# Patient Record
Sex: Male | Born: 1980 | Hispanic: Yes | Marital: Married | Smoking: Never smoker
Health system: Southern US, Community
[De-identification: ages and names within clinical notes are randomized; demographics above are authoritative.]

## PROBLEM LIST (undated history)

## (undated) DIAGNOSIS — G510 Bell's palsy: Secondary | ICD-10-CM

## (undated) HISTORY — DX: Bell's palsy: G51.0

---

## 2017-02-24 ENCOUNTER — Emergency Department (HOSPITAL_COMMUNITY): Payer: Self-pay

## 2017-02-24 ENCOUNTER — Emergency Department (HOSPITAL_COMMUNITY)
Admission: EM | Admit: 2017-02-24 | Discharge: 2017-02-25 | Disposition: A | Discharge status: Home / Self Care | Payer: Self-pay | Attending: Emergency Medicine | Admitting: Emergency Medicine

## 2017-02-24 ENCOUNTER — Encounter (HOSPITAL_COMMUNITY): Payer: Self-pay

## 2017-02-24 DIAGNOSIS — W11XXXA Fall on and from ladder, initial encounter: Secondary | ICD-10-CM | POA: Insufficient documentation

## 2017-02-24 DIAGNOSIS — S41112A Laceration without foreign body of left upper arm, initial encounter: Secondary | ICD-10-CM | POA: Insufficient documentation

## 2017-02-24 DIAGNOSIS — Y999 Unspecified external cause status: Secondary | ICD-10-CM | POA: Insufficient documentation

## 2017-02-24 DIAGNOSIS — Z23 Encounter for immunization: Secondary | ICD-10-CM | POA: Insufficient documentation

## 2017-02-24 DIAGNOSIS — Y929 Unspecified place or not applicable: Secondary | ICD-10-CM | POA: Insufficient documentation

## 2017-02-24 DIAGNOSIS — W19XXXA Unspecified fall, initial encounter: Secondary | ICD-10-CM

## 2017-02-24 DIAGNOSIS — Y939 Activity, unspecified: Secondary | ICD-10-CM | POA: Insufficient documentation

## 2017-02-24 NOTE — ED Triage Notes (Addendum)
Pt reports cutting arm on a stick/limb after falling off a ladder this afternoon.  Large gash noted on anterior/medial upper arm.  Pressure dressing applied.  Pt is concerned about having foreign body in arm.  Pain score 9/10.  Pt has not taken anything for pain.

## 2017-02-25 ENCOUNTER — Emergency Department (HOSPITAL_COMMUNITY): Payer: Self-pay

## 2017-02-25 MED ORDER — TETANUS-DIPHTH-ACELL PERTUSSIS 5-2.5-18.5 LF-MCG/0.5 IM SUSP
0.5000 mL | Freq: Once | INTRAMUSCULAR | Status: AC
  Administered 2017-02-25: 0.5 mL via INTRAMUSCULAR
  Filled 2017-02-25: qty 0.5

## 2017-02-25 MED ORDER — LIDOCAINE-EPINEPHRINE (PF) 2 %-1:200000 IJ SOLN
20.0000 mL | Freq: Once | INTRAMUSCULAR | Status: AC
  Administered 2017-02-25: 20 mL

## 2017-02-25 MED ORDER — DTAP-HEPATITIS B RECOMB-IPV IM SUSP: 0.5000 mL | Freq: Once | INTRAMUSCULAR | Status: DC

## 2017-02-25 MED ORDER — OXYCODONE-ACETAMINOPHEN 5-325 MG PO TABS
2.0000 | ORAL_TABLET | Freq: Once | ORAL | Status: AC
  Administered 2017-02-25: 2 via ORAL
  Filled 2017-02-25: qty 2

## 2017-02-25 MED ORDER — IBUPROFEN 800 MG PO TABS: 800.0000 mg | ORAL_TABLET | Freq: Three times a day (TID) | ORAL | 0 refills | Status: DC

## 2017-02-25 MED ORDER — CEPHALEXIN 500 MG PO CAPS: 500.0000 mg | ORAL_CAPSULE | Freq: Four times a day (QID) | ORAL | 0 refills | Status: DC

## 2017-02-25 NOTE — ED Provider Notes (Signed)
[facility]-EMERGENCY DEPT Provider Note   CSN: [id number] Arrival date & time: 02/24/17  1027     History   Chief Complaint Chief Complaint  Patient presents with  . Fall  . Laceration    HPI William Escobar is a 36 y.o. male with a hx of no major medical problems presents to the Emergency Department complaining of acute laceration after a 12 foot fall from a ladder at 5pm today.  Pt reports he landed on a pile of debris and is concerned that he might have a foreign body in the laceration in his arm.  Pt reports falling onto his back, but denies LOC, neck pain or back pain.  No treatments PTA.  Movement and palpation of the left arm makes it worse, nothing makes it better.  Pt denies numbness, tingling, weakness, loss of bowel or bladder control.  He reports he is able to walk.  The history is provided by the patient and medical records. No language interpreter was used.    History reviewed. No pertinent past medical history.  There are no active problems to display for this patient.   History reviewed. No pertinent surgical history.     Home Medications    Prior to Admission medications   Medication Sig Start Date End Date Taking? Authorizing Provider  cephALEXin (KEFLEX) 500 MG capsule Take 1 capsule (500 mg total) by mouth 4 (four) times daily. 02/25/17   Rustyn Conery, Dahlia Client, PA-C  ibuprofen (ADVIL,MOTRIN) 800 MG tablet Take 1 tablet (800 mg total) by mouth 3 (three) times daily. 02/25/17   Keyoni Lapinski, Dahlia Client, PA-C    Family History History reviewed. No pertinent family history.  Social History Social History  Substance Use Topics  . Smoking status: Never Smoker  . Smokeless tobacco: Never Used  . Alcohol use Yes     Allergies   Patient has no allergy information on record.   Review of Systems Review of Systems  Constitutional: Negative for appetite change, diaphoresis, fatigue, fever and unexpected weight change.  HENT:  Negative for mouth sores.   Eyes: Negative for visual disturbance.  Respiratory: Negative for cough, chest tightness, shortness of breath and wheezing.   Cardiovascular: Negative for chest pain.  Gastrointestinal: Negative for abdominal pain, constipation, diarrhea, nausea and vomiting.  Endocrine: Negative for polydipsia, polyphagia and polyuria.  Genitourinary: Negative for dysuria, frequency, hematuria and urgency.  Musculoskeletal: Positive for arthralgias. Negative for back pain and neck stiffness.  Skin: Positive for wound. Negative for rash.  Allergic/Immunologic: Negative for immunocompromised state.  Neurological: Negative for syncope, light-headedness and headaches.  Hematological: Does not bruise/bleed easily.  Psychiatric/Behavioral: Negative for sleep disturbance. The patient is not nervous/anxious.      Physical Exam Updated Vital Signs BP (!) 129/99 (BP Location: Right Arm)   Pulse 98   Temp 97.9 F (36.6 C) (Oral)   Resp 18   SpO2 99%   Physical Exam  Constitutional: He is oriented to person, place, and time. He appears well-developed and well-nourished. No distress.  Awake, alert, nontoxic appearance  HENT:  Head: Normocephalic and atraumatic.  Mouth/Throat: Oropharynx is clear and moist. No oropharyngeal exudate.  Eyes: Conjunctivae are normal. No scleral icterus.  Neck: Normal range of motion. Neck supple. No spinous process tenderness and no muscular tenderness present. No neck rigidity. Normal range of motion present.  Full range of motion without pain No midline or paraspinal tenderness No step-off or deformity  Cardiovascular: Normal rate, regular rhythm, normal heart sounds and intact distal  pulses.   No murmur heard. Capillary refill < 3 sec  Pulmonary/Chest: Effort normal and breath sounds normal. No respiratory distress. He has no wheezes. He exhibits no tenderness.  Equal chest expansion No contusion or ecchymosis No flail segment  Abdominal:  Soft. Bowel sounds are normal. He exhibits no mass. There is no tenderness. There is no rebound and no guarding.  Soft and nontender No ecchymosis or contusion  Musculoskeletal: Normal range of motion. He exhibits no edema.  Full range of motion of the left upper extremity 3 cm laceration and a large puncture wound to the medial portion of the left upper arm, oozing blood. Full range of motion of the T-spine and L-spine No midline or paraspinal tenderness to the T-spine or L-spine  Neurological: He is alert and oriented to person, place, and time.  Strength 5/5 in the left upper arm bilateral upper extremities Sensation intact to normal touch in the bilateral upper extremities Speech is clear and goal oriented Moves extremities without ataxia Steady gait  Skin: Skin is warm and dry. He is not diaphoretic.  Psychiatric: He has a normal mood and affect.  Nursing note and vitals reviewed.    ED Treatments / Results   Radiology Ct Head Wo Contrast  Result Date: 02/25/2017 CLINICAL DATA:  36 year old male with fall. EXAM: CT HEAD WITHOUT CONTRAST CT CERVICAL SPINE WITHOUT CONTRAST TECHNIQUE: Multidetector CT imaging of the head and cervical spine was performed following the standard protocol without intravenous contrast. Multiplanar CT image reconstructions of the cervical spine were also generated. COMPARISON:  None. FINDINGS: CT HEAD FINDINGS Brain: The ventricles and sulci appropriate size for patient's age. The great white matter discrimination is preserved. There is no acute intracranial hemorrhage. No mass effect or midline shift noted. No extra-axial fluid collection. There is slight prominence of the posterior fossa CSF space which may represent a dilated cisterna magna versus an arachnoid cyst. MRI may provide better evaluation. Vascular: No hyperdense vessel or unexpected calcification. Skull: Normal. Negative for fracture or focal lesion. Sinuses/Orbits: No acute finding. Other: None  CT CERVICAL SPINE FINDINGS Alignment: No acute subluxation. Skull base and vertebrae: No acute fracture. No primary bone lesion or focal pathologic process. Soft tissues and spinal canal: No prevertebral fluid or swelling. No visible canal hematoma. Disc levels: No acute finding. Mild degenerative changes at C3-C4 on the right. Upper chest: Negative. Other: None IMPRESSION: 1. No acute intracranial pathology. 2. Mildly dilated posterior fossa CSF space which may represent a prominent cisterna magna versus an arachnoid cyst. 3. No acute/traumatic cervical spine pathology. Electronically Signed   By: Elgie Collard M.D.   On: 02/25/2017 03:44   Ct Cervical Spine Wo Contrast  Result Date: 02/25/2017 CLINICAL DATA:  36 year old male with fall. EXAM: CT HEAD WITHOUT CONTRAST CT CERVICAL SPINE WITHOUT CONTRAST TECHNIQUE: Multidetector CT imaging of the head and cervical spine was performed following the standard protocol without intravenous contrast. Multiplanar CT image reconstructions of the cervical spine were also generated. COMPARISON:  None. FINDINGS: CT HEAD FINDINGS Brain: The ventricles and sulci appropriate size for patient's age. The great white matter discrimination is preserved. There is no acute intracranial hemorrhage. No mass effect or midline shift noted. No extra-axial fluid collection. There is slight prominence of the posterior fossa CSF space which may represent a dilated cisterna magna versus an arachnoid cyst. MRI may provide better evaluation. Vascular: No hyperdense vessel or unexpected calcification. Skull: Normal. Negative for fracture or focal lesion. Sinuses/Orbits: No acute finding. Other: None  CT CERVICAL SPINE FINDINGS Alignment: No acute subluxation. Skull base and vertebrae: No acute fracture. No primary bone lesion or focal pathologic process. Soft tissues and spinal canal: No prevertebral fluid or swelling. No visible canal hematoma. Disc levels: No acute finding. Mild  degenerative changes at C3-C4 on the right. Upper chest: Negative. Other: None IMPRESSION: 1. No acute intracranial pathology. 2. Mildly dilated posterior fossa CSF space which may represent a prominent cisterna magna versus an arachnoid cyst. 3. No acute/traumatic cervical spine pathology. Electronically Signed   By: Elgie Collard M.D.   On: 02/25/2017 03:44   Dg Humerus Left  Result Date: 02/25/2017 CLINICAL DATA:  Fall off ladder this afternoon with left arm pain and laceration, cut arm on stick/limb. Question foreign body. EXAM: LEFT HUMERUS - 2+ VIEW COMPARISON:  None. FINDINGS: There is no evidence of fracture or other focal bone lesions. Soft tissue air in the proximal medial upper arm just distal to the axilla. No radiopaque foreign body. IMPRESSION: Soft tissue air in the medial upper arm consistent with laceration. No radiopaque foreign body or acute osseous abnormality. Electronically Signed   By: Rubye Oaks M.D.   On: 02/25/2017 00:01    Procedures .Marland KitchenLaceration Repair Date/Time: 02/25/2017 3:00 AM Performed by: Dierdre Forth Authorized by: Dierdre Forth   Consent:    Consent obtained:  Verbal   Consent given by:  Patient   Risks discussed:  Infection, pain, poor cosmetic result and retained foreign body   Alternatives discussed:  No treatment Anesthesia (see MAR for exact dosages):    Anesthesia method:  Local infiltration   Local anesthetic:  Lidocaine 2% WITH epi (12mL) Laceration details:    Location:  Shoulder/arm   Shoulder/arm location:  L upper arm   Length (cm):  3 Repair type:    Repair type:  Simple Pre-procedure details:    Preparation:  Patient was prepped and draped in usual sterile fashion Exploration:    Hemostasis achieved with:  Epinephrine and direct pressure   Wound exploration comment:  Entire depth of wound irrigated and probed; unable to visualize the base due to tracking of the wound.   Contaminated: yes   Treatment:    Area  cleansed with:  Saline   Amount of cleaning:  Extensive   Irrigation solution:  Sterile water   Irrigation volume:    Irrigation method:  Syringe Skin repair:    Repair method:  Sutures   Suture size:  3-0   Suture material:  Plain gut   Suture technique:  Simple interrupted   Number of sutures:  3 Approximation:    Approximation:  Loose Post-procedure details:    Dressing:  Open (no dressing)   Patient tolerance of procedure:  Tolerated well, no immediate complications    (including critical care time)  Medications Ordered in ED Medications  lidocaine-EPINEPHrine (XYLOCAINE W/EPI) 2 %-1:200000 (PF) injection 20 mL (not administered)  oxyCODONE-acetaminophen (PERCOCET/ROXICET) 5-325 MG per tablet 2 tablet (2 tablets Oral Given 02/25/17 0103)  Tdap (BOOSTRIX) injection 0.5 mL (0.5 mLs Intramuscular Given 02/25/17 0103)     Initial Impression / Assessment and Plan / ED Course  I have reviewed the triage vital signs and the nursing notes.  Pertinent labs & imaging results that were available during my care of the patient were reviewed by me and considered in my medical decision making (see chart for details).     Patient presents after 12 foot fall with large puncture wound to the left upper arm.  Patient is concerned about retained foreign body.  None visualized on plain film or with wound exploration.  Tetanus updated.  Pressure irrigation performed. Wound explored in a bloodless field.  Laceration occurred < 8 hours prior to repair which was well tolerated.  Tdap updated.  Pt has no comorbidities to effect normal wound healing. Pt discharged with antibiotics, due to the contaminated nature of the wound.  Discussed suture home care with patient and answered questions.  If retained wood, it should begin to work itself out.  Pt to follow-up for wound check and suture removal in 7 days; they are to return to the ED sooner for signs of infection.   Due to significant height of  the fall and patient's distraction with left arm laceration, CT head and neck were obtained.  No acute abnormalities are noted.  Of note, patient with a prominent cisterna magna versus an arachnoid cyst.  Patient without headache or vision changes.  Will refer to primary care and neurology for outpatient MRI for further investigation.   Pt walks with steady gait.  Pt continues to deny back or hip pain on repeat exam.  Pt is hemodynamically stable with no complaints prior to dc.    Final Clinical Impressions(s) / ED Diagnoses   Final diagnoses:  Fall, initial encounter  Arm laceration, left, initial encounter    New Prescriptions New Prescriptions   CEPHALEXIN (KEFLEX) 500 MG CAPSULE    Take 1 capsule (500 mg total) by mouth 4 (four) times daily.   IBUPROFEN (ADVIL,MOTRIN) 800 MG TABLET    Take 1 tablet (800 mg total) by mouth 3 (three) times daily.     Braedyn Riggle, Dahlia ClientHannah, PA-C 02/25/17 0413    Ward, Layla MawKristen N, DO 02/25/17 0530

## 2017-02-25 NOTE — ED Notes (Signed)
Pt. Ambulated in hallway without distress.

## 2017-02-25 NOTE — Discharge Instructions (Signed)
1. Medications: ibuprofen for pain, keflex to help prevent infection, usual home medications 2. Treatment: rest, drink plenty of fluids, warm compresses to the wound.  No foreign body was seen on x-ray or able to be located with exploration of the wound, but you may still have some wood in the wound.  If so, it will begin to work its way out.  If symptoms worsen or signs of infection develop please return to the ED.   3. Follow Up: Please followup with your primary doctor and neurology in 1 week for discussion of your CT scan.  You may need an MRI for further evaluation.

## 2017-02-25 NOTE — ED Notes (Signed)
Patient transported to CT 

## 2018-06-20 ENCOUNTER — Emergency Department (HOSPITAL_COMMUNITY)
Admission: EM | Admit: 2018-06-20 | Discharge: 2018-06-20 | Disposition: A | Discharge status: Home / Self Care | Payer: Self-pay | Attending: Emergency Medicine | Admitting: Emergency Medicine

## 2018-06-20 ENCOUNTER — Encounter (HOSPITAL_COMMUNITY): Payer: Self-pay

## 2018-06-20 ENCOUNTER — Emergency Department (HOSPITAL_COMMUNITY): Payer: Self-pay

## 2018-06-20 ENCOUNTER — Other Ambulatory Visit: Payer: Self-pay

## 2018-06-20 DIAGNOSIS — Z79899 Other long term (current) drug therapy: Secondary | ICD-10-CM | POA: Insufficient documentation

## 2018-06-20 DIAGNOSIS — J101 Influenza due to other identified influenza virus with other respiratory manifestations: Secondary | ICD-10-CM | POA: Insufficient documentation

## 2018-06-20 DIAGNOSIS — J111 Influenza due to unidentified influenza virus with other respiratory manifestations: Secondary | ICD-10-CM

## 2018-06-20 LAB — BASIC METABOLIC PANEL
Anion gap: 11 (ref 5–15)
BUN: 7 mg/dL (ref 6–20)
CO2: 23 mmol/L (ref 22–32)
Calcium: 9.1 mg/dL (ref 8.9–10.3)
Chloride: 103 mmol/L (ref 98–111)
Creatinine, Ser: 0.59 mg/dL — ABNORMAL LOW (ref 0.61–1.24)
GFR calc Af Amer: >60 mL/min (ref >60)
GFR calc non Af Amer: >60 mL/min (ref >60)
Glucose, Bld: 134 mg/dL — ABNORMAL HIGH (ref 70–99)
Potassium: 3.7 mmol/L (ref 3.5–5.1)
Sodium: 137 mmol/L (ref 135–145)

## 2018-06-20 LAB — CBC
HCT: 45.6 % (ref 39.0–52.0)
Hemoglobin: 15.1 g/dL (ref 13.0–17.0)
MCH: 30.1 pg (ref 26.0–34.0)
MCHC: 33.1 g/dL (ref 30.0–36.0)
MCV: 91 fL (ref 80.0–100.0)
Platelets: 293 10*3/uL (ref 150–400)
RBC: 5.01 MIL/uL (ref 4.22–5.81)
RDW: 12.9 % (ref 11.5–15.5)
WBC: 12.3 10*3/uL — ABNORMAL HIGH (ref 4.0–10.5)
nRBC: 0 % (ref 0.0–0.2)

## 2018-06-20 LAB — I-STAT TROPONIN, ED: Troponin i, poc: 0 ng/mL (ref 0.00–0.08)

## 2018-06-20 MED ORDER — SODIUM CHLORIDE 0.9% FLUSH: 3.0000 mL | Freq: Once | INTRAVENOUS | Status: DC

## 2018-06-20 MED ORDER — PROMETHAZINE-DM 6.25-15 MG/5ML PO SYRP: 10.0000 mL | ORAL_SOLUTION | Freq: Four times a day (QID) | ORAL | 0 refills | Status: DC | PRN

## 2018-06-20 MED ORDER — IBUPROFEN 800 MG PO TABS: 800.0000 mg | ORAL_TABLET | Freq: Three times a day (TID) | ORAL | 0 refills | Status: DC | PRN

## 2018-06-20 MED ORDER — KETOROLAC TROMETHAMINE 30 MG/ML IJ SOLN
30.0000 mg | Freq: Once | INTRAMUSCULAR | Status: AC
  Administered 2018-06-20: 30 mg via INTRAVENOUS
  Filled 2018-06-20: qty 1

## 2018-06-20 MED ORDER — SODIUM CHLORIDE 0.9 % IV BOLUS
1000.0000 mL | Freq: Once | INTRAVENOUS | Status: AC
  Administered 2018-06-20: 1000 mL via INTRAVENOUS

## 2018-06-20 MED ORDER — GUAIFENESIN ER 1200 MG PO TB12: 1.0000 | ORAL_TABLET | Freq: Two times a day (BID) | ORAL | 0 refills | Status: DC

## 2018-06-20 NOTE — ED Notes (Signed)
EKG given to EDP,Isaacs, MD., for review. 

## 2018-06-20 NOTE — Discharge Instructions (Addendum)
Increase your fluid intake and rest as much as possible.  Return here as needed.  Also take 1000 mg of Tylenol every 4 hours.

## 2018-06-20 NOTE — ED Triage Notes (Signed)
Pt reports chest pain, sore throat, nausea, SOB, and headache x4 days.

## 2018-06-20 NOTE — ED Provider Notes (Signed)
[facility]-EMERGENCY DEPT Provider Note   CSN: [id number] Arrival date & time: 06/20/18  1911    History   Chief Complaint Chief Complaint  Patient presents with  . Headache  . Chest Pain    HPI William Escobar is a 38 y.o. male.     HPI Patient presents to the emergency department with cough, sore throat, nausea, headache and nasal congestion over the last 4 days.  The patient states that he has taken over-the-counter medications without significant relief of his symptoms.  Patient states that he has been having chills as well.  The patient is girlfriend states that he has been laying around the last 4 days.  The patient denies chest pain, shortness of breath, headache,blurred vision, neck pain,weakness, numbness, dizziness, anorexia, edema, abdominal pain, nausea, vomiting, diarrhea, rash, back pain, dysuria, hematemesis, bloody stool, near syncope, or syncope. History reviewed. No pertinent past medical history.  There are no active problems to display for this patient.   History reviewed. No pertinent surgical history.      Home Medications    Prior to Admission medications   Medication Sig Start Date End Date Taking? Authorizing Provider  acetaminophen (TYLENOL) 325 MG tablet Take 650 mg by mouth every 6 (six) hours as needed for mild pain or headache.   Yes [provider]  guaifenesin (ROBITUSSIN) 100 MG/5ML syrup Take 100 mg by mouth 3 (three) times daily as needed for cough or congestion.   Yes [provider]  cephALEXin (KEFLEX) 500 MG capsule Take 1 capsule (500 mg total) by mouth 4 (four) times daily. Patient not taking: Reported on 06/20/2018 02/25/17   Muthersbaugh, Dahlia Client, PA-C  ibuprofen (ADVIL,MOTRIN) 800 MG tablet Take 1 tablet (800 mg total) by mouth 3 (three) times daily. Patient not taking: Reported on 06/20/2018 02/25/17   Muthersbaugh, Boyd Kerbs    Family History History reviewed. No pertinent family  history.  Social History Social History   Tobacco Use  . Smoking status: Never Smoker  . Smokeless tobacco: Never Used  Substance Use Topics  . Alcohol use: Yes  . Drug use: Yes    Types: Marijuana     Allergies   Patient has no known allergies.   Review of Systems Review of Systems All other systems negative except as documented in the HPI. All pertinent positives and negatives as reviewed in the HPI.  Physical Exam Updated Vital Signs BP (!) 153/96 (BP Location: Left Arm)   Pulse (!) 120   Temp 98.4 F (36.9 C) (Oral)   Resp 18   SpO2 98%   Physical Exam Vitals signs and nursing note reviewed.  Constitutional:      General: He is not in acute distress.    Appearance: He is well-developed.  HENT:     Head: Normocephalic and atraumatic.  Eyes:     Pupils: Pupils are equal, round, and reactive to light.  Neck:     Musculoskeletal: Normal range of motion and neck supple.  Cardiovascular:     Rate and Rhythm: Normal rate and regular rhythm.     Heart sounds: Normal heart sounds. No murmur. No friction rub. No gallop.   Pulmonary:     Effort: Pulmonary effort is normal. No respiratory distress.     Breath sounds: Normal breath sounds. No wheezing.  Abdominal:     General: Bowel sounds are normal. There is no distension.     Palpations: Abdomen is soft.     Tenderness: There is no  abdominal tenderness.  Skin:    General: Skin is warm and dry.     Capillary Refill: Capillary refill takes less than 2 seconds.     Findings: No erythema or rash.  Neurological:     Mental Status: He is alert and oriented to person, place, and time.     Motor: No abnormal muscle tone.     Coordination: Coordination normal.  Psychiatric:        Behavior: Behavior normal.      ED Treatments / Results  Labs (all labs ordered are listed, but only abnormal results are displayed) Labs Reviewed  BASIC METABOLIC PANEL - Abnormal; Notable for the following components:      Result  Value   Glucose, Bld 134 (*)    Creatinine, Ser 0.59 (*)    All other components within normal limits  CBC - Abnormal; Notable for the following components:   WBC 12.3 (*)    All other components within normal limits  I-STAT TROPONIN, ED    EKG EKG Interpretation  Date/Time:  Saturday June 20 2018 19:22:33 EST Ventricular Rate:  114 PR Interval:    QRS Duration: 104 QT Interval:  343 QTC Calculation: 473 R Axis:   64 Text Interpretation:  Sinus tachycardia Probable left atrial enlargement RSR' in V1 or V2, right VCD or RVH Abnormal inferior Q waves Borderline prolonged QT interval No old tracing to compare Confirmed by Jacalyn Lefevre [phone]) on 06/20/2018 11:16:01 PM   Radiology Dg Chest 2 View  Result Date: 06/20/2018 CLINICAL DATA:  Chest pain EXAM: CHEST - 2 VIEW COMPARISON:  None. FINDINGS: Heart and mediastinal contours are within normal limits. No focal opacities or effusions. No acute bony abnormality. IMPRESSION: No active cardiopulmonary disease. Electronically Signed   By: Charlett Nose M.D.   On: 06/20/2018 21:07    Procedures Procedures (including critical care time)  Medications Ordered in ED Medications  ketorolac (TORADOL) 30 MG/ML injection 30 mg (30 mg Intravenous Given 06/20/18 2104)  sodium chloride 0.9 % bolus 1,000 mL (0 mLs Intravenous Stopped 06/20/18 2153)     Initial Impression / Assessment and Plan / ED Course  I have reviewed the triage vital signs and the nursing notes.  Pertinent labs & imaging results that were available during my care of the patient were reviewed by me and considered in my medical decision making (see chart for details).       Patient has influenza based off of his HPI and physical exam findings.  Patient was given IV fluids and has had some improvement in his symptoms.  Is to return here as needed.  Told to rest and take Tylenol for fever.   Final Clinical Impressions(s) / ED Diagnoses   Final diagnoses:  None     ED Discharge Orders    None       Kyra Manges 06/20/18 2336    Jacalyn Lefevre, MD 06/21/18 [phone]

## 2018-11-19 ENCOUNTER — Emergency Department (HOSPITAL_COMMUNITY)
Admission: EM | Admit: 2018-11-19 | Discharge: 2018-11-19 | Disposition: A | Discharge status: Home / Self Care | Payer: Self-pay | Attending: Emergency Medicine | Admitting: Emergency Medicine

## 2018-11-19 ENCOUNTER — Emergency Department (HOSPITAL_COMMUNITY): Payer: Self-pay

## 2018-11-19 ENCOUNTER — Other Ambulatory Visit: Payer: Self-pay

## 2018-11-19 ENCOUNTER — Encounter (HOSPITAL_COMMUNITY): Payer: Self-pay | Admitting: Emergency Medicine

## 2018-11-19 DIAGNOSIS — F1290 Cannabis use, unspecified, uncomplicated: Secondary | ICD-10-CM | POA: Insufficient documentation

## 2018-11-19 DIAGNOSIS — R0602 Shortness of breath: Secondary | ICD-10-CM | POA: Insufficient documentation

## 2018-11-19 DIAGNOSIS — R072 Precordial pain: Secondary | ICD-10-CM | POA: Insufficient documentation

## 2018-11-19 DIAGNOSIS — R002 Palpitations: Secondary | ICD-10-CM | POA: Insufficient documentation

## 2018-11-19 DIAGNOSIS — Z79899 Other long term (current) drug therapy: Secondary | ICD-10-CM | POA: Insufficient documentation

## 2018-11-19 LAB — TROPONIN I (HIGH SENSITIVITY): Troponin I (High Sensitivity): 3 ng/L (ref <18)

## 2018-11-19 LAB — BASIC METABOLIC PANEL
Anion gap: 9 (ref 5–15)
BUN: 11 mg/dL (ref 6–20)
CO2: 21 mmol/L — ABNORMAL LOW (ref 22–32)
Calcium: 8.8 mg/dL — ABNORMAL LOW (ref 8.9–10.3)
Chloride: 109 mmol/L (ref 98–111)
Creatinine, Ser: 0.63 mg/dL (ref 0.61–1.24)
GFR calc Af Amer: >60 mL/min (ref >60)
GFR calc non Af Amer: >60 mL/min (ref >60)
Glucose, Bld: 100 mg/dL — ABNORMAL HIGH (ref 70–99)
Potassium: 3.9 mmol/L (ref 3.5–5.1)
Sodium: 139 mmol/L (ref 135–145)

## 2018-11-19 LAB — CBC
HCT: 42.4 % (ref 39.0–52.0)
Hemoglobin: 14.1 g/dL (ref 13.0–17.0)
MCH: 30.3 pg (ref 26.0–34.0)
MCHC: 33.3 g/dL (ref 30.0–36.0)
MCV: 91.2 fL (ref 80.0–100.0)
Platelets: 273 10*3/uL (ref 150–400)
RBC: 4.65 MIL/uL (ref 4.22–5.81)
RDW: 12.8 % (ref 11.5–15.5)
WBC: 6 10*3/uL (ref 4.0–10.5)
nRBC: 0 % (ref 0.0–0.2)

## 2018-11-19 MED ORDER — NAPROXEN 250 MG PO TABS
500.0000 mg | ORAL_TABLET | Freq: Once | ORAL | Status: AC
  Administered 2018-11-19: 500 mg via ORAL
  Filled 2018-11-19: qty 2

## 2018-11-19 MED ORDER — SODIUM CHLORIDE 0.9% FLUSH: 3.0000 mL | Freq: Once | INTRAVENOUS | Status: DC

## 2018-11-19 MED ORDER — IBUPROFEN 600 MG PO TABS: 600.0000 mg | ORAL_TABLET | Freq: Four times a day (QID) | ORAL | 0 refills | Status: DC

## 2018-11-19 NOTE — ED Triage Notes (Signed)
Onset today when patient woke up developed chest pain 6/10 with shortness of breath.

## 2018-11-19 NOTE — ED Provider Notes (Signed)
[facility] DEPARTMENT Provider Note   CSN: [id number] Arrival date & time: 11/19/18  0901    History   Chief Complaint Chief Complaint  Patient presents with  . Chest Pain    HPI Hurshell Dino is a 38 y.o. male.     HPI  Pt comes in with cc of chest pain. Chest pain x 1 week, L sided, and it feels like there is "swelling" inside. The pain is intermittent, and it will last for a whole day.  no specific evoking factor.   He has had similar attacks in the past for which he has come to the ER. He has associated palpitations and shortness of breath occasionally. He denies cough, wheezing. He uses marijuana occasionally.  Pt has no hx of PE, DVT and denies any exogenous hormone (testosterone / estrogen) use, long distance travels or surgery in the past 6 weeks, active cancer, recent immobilization.   History reviewed. No pertinent past medical history.  There are no active problems to display for this patient.   History reviewed. No pertinent surgical history.      Home Medications    Prior to Admission medications   Medication Sig Start Date End Date Taking? Authorizing Provider  acetaminophen (TYLENOL) 325 MG tablet Take 650 mg by mouth every 6 (six) hours as needed for mild pain or headache.    [provider]  cephALEXin (KEFLEX) 500 MG capsule Take 1 capsule (500 mg total) by mouth 4 (four) times daily. Patient not taking: Reported on 06/20/2018 02/25/17   Muthersbaugh, Jarrett Soho, PA-C  Guaifenesin 1200 MG TB12 Take 1 tablet (1,200 mg total) by mouth 2 (two) times daily. 06/20/18   Lawyer, Harrell Gave, PA-C  ibuprofen (ADVIL) 600 MG tablet Take 1 tablet (600 mg total) by mouth 4 (four) times daily. 11/19/18   Varney Biles, MD  promethazine-dextromethorphan (PROMETHAZINE-DM) 6.25-15 MG/5ML syrup Take 10 mLs by mouth 4 (four) times daily as needed for cough. 06/20/18   Dalia Heading, PA-C    Family History No family  history on file.  Social History Social History   Tobacco Use  . Smoking status: Never Smoker  . Smokeless tobacco: Never Used  Substance Use Topics  . Alcohol use: Yes  . Drug use: Yes    Types: Marijuana     Allergies   Patient has no known allergies.   Review of Systems Review of Systems  Constitutional: Positive for activity change.  Respiratory: Positive for chest tightness.   Cardiovascular: Positive for chest pain.  Gastrointestinal: Negative for nausea and vomiting.  Allergic/Immunologic: Negative for immunocompromised state.  Hematological: Does not bruise/bleed easily.  All other systems reviewed and are negative.    Physical Exam Updated Vital Signs BP (!) 126/92   Pulse 68   Temp 98.6 F (37 C) (Oral)   Resp 18   Ht 5\' 8"  (1.727 m)   Wt 90 kg   SpO2 98%   BMI 30.17 kg/m   Physical Exam Vitals signs and nursing note reviewed.  Constitutional:      Appearance: He is well-developed.  HENT:     Head: Atraumatic.  Neck:     Musculoskeletal: Neck supple.  Cardiovascular:     Rate and Rhythm: Normal rate.     Heart sounds: Normal heart sounds.  Pulmonary:     Effort: Pulmonary effort is normal.     Breath sounds: Normal breath sounds. No decreased breath sounds, wheezing, rhonchi or rales.  Musculoskeletal:  Right lower leg: No edema.     Left lower leg: No edema.  Skin:    General: Skin is warm.  Neurological:     Mental Status: He is alert and oriented to person, place, and time.      ED Treatments / Results  Labs (all labs ordered are listed, but only abnormal results are displayed) Labs Reviewed  BASIC METABOLIC PANEL - Abnormal; Notable for the following components:      Result Value   CO2 21 (*)    Glucose, Bld 100 (*)    Calcium 8.8 (*)    All other components within normal limits  CBC  TROPONIN I (HIGH SENSITIVITY)    EKG EKG Interpretation  Date/Time:  Thursday November 19 2018 09:14:08 EDT Ventricular Rate:  83 PR  Interval:  158 QRS Duration: 106 QT Interval:  386 QTC Calculation: 453 R Axis:   40 Text Interpretation:  Normal sinus rhythm Incomplete right bundle branch block Borderline ECG s1q3t3 is new RSR prime is not new Confirmed by Derwood KaplanNanavati, Jacere Pangborn ([id number]) on 11/19/2018 10:29:09 AM   Radiology No results found.  Procedures Procedures (including critical care time)  Medications Ordered in ED Medications  naproxen (NAPROSYN) tablet 500 mg (500 mg Oral Given 11/19/18 1113)     Initial Impression / Assessment and Plan / ED Course  I have reviewed the triage vital signs and the nursing notes.  Pertinent labs & imaging results that were available during my care of the patient were reviewed by me and considered in my medical decision making (see chart for details).       38 year old comes in a chief complaint of chest pain.  Patient is having nonspecific chest pain that feels not superficial but internal to him.  He is having difficulty describing the pain, but he has had the pain in the past.  Clinically does not appear that he has ACS, PE, pericarditis.  The pain could be pleurisy.  It also does not appear to be GI or musculoskeletal in nature.  Patient has a well score of 0 and is PERC negative.  EKG does show RSR prime in V1 V2 and S1Q3T3.  He unfortunately does not have a PCP, I think he might benefit by getting an echocardiogram.  We will give him cardiology follow-up not for ACS work-up but perhaps to see if they can get an echocardiogram.  We will start patient on anti-inflammatory medication and he has been advised to call cardiology if the anti-inflammatory medication does not resolve the pain.  Final Clinical Impressions(s) / ED Diagnoses   Final diagnoses:  Precordial chest pain    ED Discharge Orders         Ordered    ibuprofen (ADVIL) 600 MG tablet  4 times daily     11/19/18 1107           Derwood KaplanNanavati, Jacolyn Joaquin, MD 11/22/18 1512

## 2018-12-03 ENCOUNTER — Telehealth (INDEPENDENT_AMBULATORY_CARE_PROVIDER_SITE_OTHER): Payer: Self-pay | Admitting: Internal Medicine

## 2018-12-03 ENCOUNTER — Telehealth: Payer: Self-pay

## 2018-12-03 ENCOUNTER — Telehealth: Payer: Self-pay | Admitting: Internal Medicine

## 2018-12-03 ENCOUNTER — Other Ambulatory Visit: Payer: Self-pay

## 2018-12-03 NOTE — Telephone Encounter (Signed)
Called this patient through the interpreter line to reschedule the appointment today to another day and the line was busy.

## 2018-12-03 NOTE — Telephone Encounter (Signed)
Left message for patient to call office back to prechart before 10:40 Virtual Visit

## 2018-12-03 NOTE — Patient Instructions (Signed)
Medication Instructions:     If you need a refill on your cardiac medications before your next appointment, please call your pharmacy.   Lab work:    If you have labs (blood work) drawn today and your tests are completely normal, you will receive your results only by: . MyChart Message (if you have MyChart) OR . A paper copy in the mail If you have any lab test that is abnormal or we need to change your treatment, we will call you to review the results.  Testing/Procedures:   Follow-Up: At [facility], you and your health needs are our priority.  As part of our continuing mission to provide you with exceptional heart care, we have created designated Provider Care Teams.  These Care Teams include your primary Cardiologist (physician) and Advanced Practice Providers (APPs -  Physician Assistants and Nurse Practitioners) who all work together to provide you with the care you need, when you need it. . You will need a follow up appointment in                 months.  Please call our office 2 months in advance to schedule this appointment.  You may see Gayatri A Acharya, MD*or one of the following Advanced Practice Providers on your designated Care Team:   . Rhonda Barrett, PA-C . Kathryn Lawrence, DNP, ANP  Any Other Special Instructions Will Be Listed Below (If Applicable). 

## 2018-12-03 NOTE — Telephone Encounter (Signed)
Spoke with Wife and she stated she will call office back to reschedule appointment

## 2018-12-09 NOTE — Progress Notes (Signed)
Patient did not show up for their scheduled appt.

## 2019-04-16 ENCOUNTER — Emergency Department (HOSPITAL_COMMUNITY): Payer: Self-pay

## 2019-04-16 ENCOUNTER — Emergency Department (HOSPITAL_COMMUNITY)
Admission: EM | Admit: 2019-04-16 | Discharge: 2019-04-16 | Disposition: A | Discharge status: Home / Self Care | Payer: Self-pay | Attending: Emergency Medicine | Admitting: Emergency Medicine

## 2019-04-16 ENCOUNTER — Encounter (HOSPITAL_COMMUNITY): Payer: Self-pay | Admitting: Emergency Medicine

## 2019-04-16 ENCOUNTER — Other Ambulatory Visit: Payer: Self-pay

## 2019-04-16 DIAGNOSIS — U071 COVID-19: Secondary | ICD-10-CM | POA: Insufficient documentation

## 2019-04-16 DIAGNOSIS — N281 Cyst of kidney, acquired: Secondary | ICD-10-CM | POA: Insufficient documentation

## 2019-04-16 DIAGNOSIS — R1012 Left upper quadrant pain: Secondary | ICD-10-CM | POA: Insufficient documentation

## 2019-04-16 DIAGNOSIS — M7918 Myalgia, other site: Secondary | ICD-10-CM | POA: Insufficient documentation

## 2019-04-16 DIAGNOSIS — R110 Nausea: Secondary | ICD-10-CM | POA: Insufficient documentation

## 2019-04-16 DIAGNOSIS — R05 Cough: Secondary | ICD-10-CM | POA: Insufficient documentation

## 2019-04-16 DIAGNOSIS — R300 Dysuria: Secondary | ICD-10-CM | POA: Insufficient documentation

## 2019-04-16 LAB — URINALYSIS, ROUTINE W REFLEX MICROSCOPIC
Bacteria, UA: NONE SEEN
Bilirubin Urine: NEGATIVE
Glucose, UA: NEGATIVE mg/dL
Hgb urine dipstick: NEGATIVE
Ketones, ur: NEGATIVE mg/dL
Leukocytes,Ua: NEGATIVE
Nitrite: NEGATIVE
Protein, ur: 30 mg/dL — AB
Specific Gravity, Urine: 1.03 (ref 1.005–1.030)
pH: 5 (ref 5.0–8.0)

## 2019-04-16 LAB — CBC WITH DIFFERENTIAL/PLATELET
Abs Immature Granulocytes: 0.01 10*3/uL (ref 0.00–0.07)
Basophils Absolute: 0 10*3/uL (ref 0.0–0.1)
Basophils Relative: 1 %
Eosinophils Absolute: 0.1 10*3/uL (ref 0.0–0.5)
Eosinophils Relative: 3 %
HCT: 47.6 % (ref 39.0–52.0)
Hemoglobin: 15.7 g/dL (ref 13.0–17.0)
Immature Granulocytes: 0 %
Lymphocytes Relative: 26 %
Lymphs Abs: 1.1 10*3/uL (ref 0.7–4.0)
MCH: 29.5 pg (ref 26.0–34.0)
MCHC: 33 g/dL (ref 30.0–36.0)
MCV: 89.3 fL (ref 80.0–100.0)
Monocytes Absolute: 0.6 10*3/uL (ref 0.1–1.0)
Monocytes Relative: 15 %
Neutro Abs: 2.2 10*3/uL (ref 1.7–7.7)
Neutrophils Relative %: 55 %
Platelets: 228 10*3/uL (ref 150–400)
RBC: 5.33 MIL/uL (ref 4.22–5.81)
RDW: 13.4 % (ref 11.5–15.5)
WBC: 4 10*3/uL (ref 4.0–10.5)
nRBC: 0 % (ref 0.0–0.2)

## 2019-04-16 LAB — COMPREHENSIVE METABOLIC PANEL
ALT: 72 U/L — ABNORMAL HIGH (ref 0–44)
AST: 48 U/L — ABNORMAL HIGH (ref 15–41)
Albumin: 4.5 g/dL (ref 3.5–5.0)
Alkaline Phosphatase: 78 U/L (ref 38–126)
Anion gap: 12 (ref 5–15)
BUN: 12 mg/dL (ref 6–20)
CO2: 22 mmol/L (ref 22–32)
Calcium: 9 mg/dL (ref 8.9–10.3)
Chloride: 106 mmol/L (ref 98–111)
Creatinine, Ser: 0.7 mg/dL (ref 0.61–1.24)
GFR calc Af Amer: >60 mL/min (ref >60)
GFR calc non Af Amer: >60 mL/min (ref >60)
Glucose, Bld: 104 mg/dL — ABNORMAL HIGH (ref 70–99)
Potassium: 3.8 mmol/L (ref 3.5–5.1)
Sodium: 140 mmol/L (ref 135–145)
Total Bilirubin: 0.5 mg/dL (ref 0.3–1.2)
Total Protein: 8 g/dL (ref 6.5–8.1)

## 2019-04-16 LAB — POC SARS CORONAVIRUS 2 AG -  ED: SARS Coronavirus 2 Ag: POSITIVE — AB

## 2019-04-16 LAB — LIPASE, BLOOD: Lipase: 22 U/L (ref 11–51)

## 2019-04-16 MED ORDER — SODIUM CHLORIDE 0.9 % IV BOLUS
1000.0000 mL | Freq: Once | INTRAVENOUS | Status: AC
  Administered 2019-04-16: 10:00:00 1000 mL via INTRAVENOUS

## 2019-04-16 MED ORDER — IOHEXOL 300 MG/ML  SOLN
100.0000 mL | Freq: Once | INTRAMUSCULAR | Status: AC | PRN
  Administered 2019-04-16: 11:00:00 100 mL via INTRAVENOUS

## 2019-04-16 MED ORDER — ACETAMINOPHEN 325 MG PO TABS: 650.0000 mg | ORAL_TABLET | Freq: Four times a day (QID) | ORAL | 0 refills | Status: AC | PRN

## 2019-04-16 MED ORDER — HYDROMORPHONE HCL 1 MG/ML IJ SOLN
1.0000 mg | Freq: Once | INTRAMUSCULAR | Status: AC
  Administered 2019-04-16: 1 mg via INTRAVENOUS
  Filled 2019-04-16: qty 1

## 2019-04-16 MED ORDER — IBUPROFEN 600 MG PO TABS: 600.0000 mg | ORAL_TABLET | Freq: Three times a day (TID) | ORAL | 0 refills | Status: DC | PRN

## 2019-04-16 NOTE — ED Provider Notes (Signed)
[facility]   CSN: [id number] Arrival date & time: 04/16/19  0730     History Chief Complaint  Patient presents with  . Fever  . Abdominal Pain    William SaxSergio Escobar is a 38 y.o. male.  HPI  38 year old male presents with fever and abdominal pain.  History is obtained with help of Spanish interpreter line.  The patient has been feeling poorly for a couple days including subjective fever, cough, sore throat, headache and body aches.  He has also noticed nausea and left-sided abdominal/lower chest pain and left back pain.  Feels like the pain goes through to his back.  He has tried aspirin for his fever.  He would like to be tested for the novel coronavirus.  He does feel short of breath as well. Some discomfort with urinating.  History reviewed. No pertinent past medical history.  There are no problems to display for this patient.   History reviewed. No pertinent surgical history.     No family history on file.  Social History   Tobacco Use  . Smoking status: Never Smoker  . Smokeless tobacco: Never Used  Substance Use Topics  . Alcohol use: Yes  . Drug use: Yes    Types: Marijuana    Home Medications Prior to Admission medications   Medication Sig Start Date End Date Taking? Authorizing Provider  OVER THE COUNTER MEDICATION OTC cold medication over the past few days for symptoms   Yes [provider]  acetaminophen (TYLENOL) 325 MG tablet Take 2 tablets (650 mg total) by mouth every 6 (six) hours as needed for mild pain, moderate pain, fever or headache. 04/16/19   Pricilla LovelessGoldston, Lorenna Lurry, MD  ibuprofen (ADVIL) 600 MG tablet Take 1 tablet (600 mg total) by mouth every 8 (eight) hours as needed for fever, headache, mild pain or moderate pain. 04/16/19   Pricilla LovelessGoldston, Neita Landrigan, MD    Allergies    Patient has no known allergies.  Review of Systems   Review of Systems  Constitutional: Positive for fever.  HENT:  Positive for sore throat.   Respiratory: Positive for cough and shortness of breath.   Gastrointestinal: Positive for abdominal pain and nausea. Negative for vomiting.  Genitourinary: Positive for dysuria.  Musculoskeletal: Positive for back pain.  Neurological: Positive for headaches.  All other systems reviewed and are negative.   Physical Exam Updated Vital Signs BP 123/88   Pulse 81   Temp 98.5 F (36.9 C)   Resp 16   SpO2 97%   Physical Exam Vitals and nursing note reviewed.  Constitutional:      General: He is not in acute distress.    Appearance: He is well-developed. He is not ill-appearing or diaphoretic.  HENT:     Head: Normocephalic and atraumatic.     Right Ear: External ear normal.     Left Ear: External ear normal.     Nose: Nose normal.  Eyes:     General:        Right eye: No discharge.        Left eye: No discharge.  Cardiovascular:     Rate and Rhythm: Normal rate.  Pulmonary:     Effort: Pulmonary effort is normal.     Comments: Speaks in clear sentences, no increased WOB Abdominal:     Palpations: Abdomen is soft.     Tenderness: There is abdominal tenderness (mild) in the left upper quadrant. There is left CVA tenderness.  Musculoskeletal:  Cervical back: Neck supple.  Skin:    General: Skin is warm and dry.  Neurological:     Mental Status: He is alert.  Psychiatric:        Mood and Affect: Mood is not anxious.     ED Results / Procedures / Treatments   Labs (all labs ordered are listed, but only abnormal results are displayed) Labs Reviewed  COMPREHENSIVE METABOLIC PANEL - Abnormal; Notable for the following components:      Result Value   Glucose, Bld 104 (*)    AST 48 (*)    ALT 72 (*)    All other components within normal limits  URINALYSIS, ROUTINE W REFLEX MICROSCOPIC - Abnormal; Notable for the following components:   Protein, ur 30 (*)    All other components within normal limits  POC SARS CORONAVIRUS 2 AG -  ED -  Abnormal; Notable for the following components:   SARS Coronavirus 2 Ag POSITIVE (*)    All other components within normal limits  CBC WITH DIFFERENTIAL/PLATELET  LIPASE, BLOOD    EKG None  Radiology DG Chest 2 View  Result Date: 04/16/2019 CLINICAL DATA:  Fever, body aches, sore throat and cough. EXAM: CHEST - 2 VIEW COMPARISON:  11/19/2018 FINDINGS: Heart size is normal. Mediastinal shadows are normal. The lungs are clear. No infiltrate, collapse or effusion. No significant bone finding. IMPRESSION: No active cardiopulmonary disease. Electronically Signed   By: Paulina Fusi M.D.   On: 04/16/2019 07:55   CT ABDOMEN PELVIS W CONTRAST  Result Date: 04/16/2019 CLINICAL DATA:  Abdominal pain, fever EXAM: CT ABDOMEN AND PELVIS WITH CONTRAST TECHNIQUE: Multidetector CT imaging of the abdomen and pelvis was performed using the standard protocol following bolus administration of intravenous contrast. CONTRAST:  OMNIPAQUE IOHEXOL 300 MG/ML  SOLN COMPARISON:  Chest x-ray 11/19/2018 FINDINGS: Lower chest: No acute abnormality. Hepatobiliary: No focal liver abnormality is seen. No gallstones, gallbladder wall thickening, or biliary dilatation. Pancreas: Unremarkable. No pancreatic ductal dilatation or surrounding inflammatory changes. Spleen: Normal in size without focal abnormality. Adrenals/Urinary Tract: Unremarkable adrenal glands. Cystic lesion with internal septation at the inferior pole of the right kidney measuring up to 2.2 cm, which may represent 2 adjacent cysts. Kidneys are otherwise unremarkable in appearance. No hydronephrosis. Ureters are nondilated. Urinary bladder unremarkable for the degree of distension. Stomach/Bowel: Stomach is within normal limits. Appendix appears normal. No evidence of bowel wall thickening, distention, or inflammatory changes. Vascular/Lymphatic: No significant vascular findings are present. No enlarged abdominal or pelvic lymph nodes. Reproductive: Prostate is  unremarkable. Other: No abdominal wall hernia or abnormality. No abdominopelvic ascites. Musculoskeletal: Wedge compression deformity of the L1 vertebral body with approximately 40% height loss. Mild depression of the superior endplate of T12 with less than 25% vertebral body height loss. No acute fracture line is evident. Remaining osseous structures appear intact and unremarkable. IMPRESSION: 1. No acute abdominopelvic findings1. 2. Moderate L1 vertebral body compression fracture and mild T12 compression fracture, which were retrospectively apparent on prior chest x-ray 11/19/2018. 3. Mildly complex appearing cystic lesion with internal septation at the inferior pole of the right kidney measuring up to 2.2 cm. Findings can be further evaluated with a nonemergent renal ultrasound on an outpatient basis. Electronically Signed   By: Duanne Guess M.D.   On: 04/16/2019 10:42    Procedures Procedures (including critical care time)  Medications Ordered in ED Medications  HYDROmorphone (DILAUDID) injection 1 mg (1 mg Intravenous Given 04/16/19 0954)  sodium chloride 0.9 %  bolus 1,000 mL (0 mLs Intravenous Stopped 04/16/19 1145)  iohexol (OMNIPAQUE) 300 MG/ML solution 100 mL (100 mLs Intravenous Contrast Given 04/16/19 1031)    ED Course  I have reviewed the triage vital signs and the nursing notes.  Pertinent labs & imaging results that were available during my care of the patient were reviewed by me and considered in my medical decision making (see chart for details).    MDM Rules/Calculators/A&P                      Patient's presentation appears to be due to a diagnosis of novel coronavirus infection.  Vital signs are normal.  He reports dyspnea but no increased work of breathing, hypoxia, etc. unclear why he is having the abdominal/flank pain but urine is fine besides some mild protein and CT abdomen pelvis does not show an acute emergent condition.  He was made aware of the cyst and need for  outpatient ultrasound through the interpreter.  He appears stable for quarantine at home and discharge with return precautions. Final Clinical Impression(s) / ED Diagnoses Final diagnoses:  DVVOH-60 virus infection  Renal cyst, right    Rx / DC Orders ED Discharge Orders         Ordered    ibuprofen (ADVIL) 600 MG tablet  Every 8 hours PRN     04/16/19 1156    acetaminophen (TYLENOL) 325 MG tablet  Every 6 hours PRN     04/16/19 1156           Sherwood Gambler, MD 04/16/19 1444

## 2019-04-16 NOTE — ED Notes (Signed)
Patient verbalizes understanding of discharge instructions. Opportunity for questioning and answers were provided. Pt discharged from ED. 

## 2019-04-16 NOTE — ED Notes (Signed)
Patient transported to X-ray 

## 2019-04-16 NOTE — ED Triage Notes (Signed)
Pt reports fever, body aches and sore throat x2 days, denies any recent sick contacts. Taking aspirin at home for fevers. resp e/u, nad. Spanish interpretor utilized for triage.

## 2019-04-16 NOTE — Discharge Instructions (Addendum)
If you develop worsening, continued, or recurrent abdominal pain, uncontrolled vomiting, fever, chest or back pain, trouble breathing or any other new/concerning symptoms then return to the ER for evaluation.   Your CT scan showed a cyst on your right kidney.  This needs to be followed up with an ultrasound by primary care physician.  If you do not have a physician then use the referral given.  Si presenta un dolor abdominal que empeora, contina o es recurrente, vmitos incontrolados, fiebre, Social research officer, government en el pecho o la espalda, dificultad para respirar o cualquier otro sntoma nuevo o preocupante, regrese a la sala de emergencias para una evaluacin.  Su tomografa computarizada mostr un quiste en su rin derecho. Esto debe ser seguido con una ecografa por parte de un mdico de atencin primaria. Si no tiene un mdico, utilice la Control and instrumentation engineer.

## 2019-10-21 ENCOUNTER — Encounter (HOSPITAL_COMMUNITY): Payer: Self-pay | Admitting: *Deleted

## 2019-10-21 ENCOUNTER — Other Ambulatory Visit: Payer: Self-pay

## 2019-10-21 ENCOUNTER — Emergency Department (HOSPITAL_COMMUNITY): Payer: Self-pay

## 2019-10-21 ENCOUNTER — Emergency Department (HOSPITAL_COMMUNITY)
Admission: EM | Admit: 2019-10-21 | Discharge: 2019-10-22 | Disposition: A | Discharge status: Home / Self Care | Payer: Self-pay | Attending: Emergency Medicine | Admitting: Emergency Medicine

## 2019-10-21 DIAGNOSIS — S36119D Unspecified injury of liver, subsequent encounter: Secondary | ICD-10-CM

## 2019-10-21 DIAGNOSIS — J189 Pneumonia, unspecified organism: Secondary | ICD-10-CM

## 2019-10-21 DIAGNOSIS — M25561 Pain in right knee: Secondary | ICD-10-CM

## 2019-10-21 DIAGNOSIS — R079 Chest pain, unspecified: Secondary | ICD-10-CM | POA: Insufficient documentation

## 2019-10-21 DIAGNOSIS — S2241XS Multiple fractures of ribs, right side, sequela: Secondary | ICD-10-CM

## 2019-10-21 DIAGNOSIS — R7989 Other specified abnormal findings of blood chemistry: Secondary | ICD-10-CM

## 2019-10-21 DIAGNOSIS — R509 Fever, unspecified: Secondary | ICD-10-CM | POA: Insufficient documentation

## 2019-10-21 DIAGNOSIS — R0602 Shortness of breath: Secondary | ICD-10-CM | POA: Insufficient documentation

## 2019-10-21 DIAGNOSIS — Z20822 Contact with and (suspected) exposure to covid-19: Secondary | ICD-10-CM | POA: Insufficient documentation

## 2019-10-21 DIAGNOSIS — S32010A Wedge compression fracture of first lumbar vertebra, initial encounter for closed fracture: Secondary | ICD-10-CM

## 2019-10-21 DIAGNOSIS — E2749 Other adrenocortical insufficiency: Secondary | ICD-10-CM

## 2019-10-21 DIAGNOSIS — R1031 Right lower quadrant pain: Secondary | ICD-10-CM | POA: Insufficient documentation

## 2019-10-21 DIAGNOSIS — W19XXXD Unspecified fall, subsequent encounter: Secondary | ICD-10-CM

## 2019-10-21 DIAGNOSIS — S22060A Wedge compression fracture of T7-T8 vertebra, initial encounter for closed fracture: Secondary | ICD-10-CM

## 2019-10-21 LAB — CBC
HCT: 41.4 % (ref 39.0–52.0)
Hemoglobin: 13.6 g/dL (ref 13.0–17.0)
MCH: 30.4 pg (ref 26.0–34.0)
MCHC: 32.9 g/dL (ref 30.0–36.0)
MCV: 92.4 fL (ref 80.0–100.0)
Platelets: 409 10*3/uL — ABNORMAL HIGH (ref 150–400)
RBC: 4.48 MIL/uL (ref 4.22–5.81)
RDW: 12.2 % (ref 11.5–15.5)
WBC: 6.8 10*3/uL (ref 4.0–10.5)
nRBC: 0 % (ref 0.0–0.2)

## 2019-10-21 LAB — HEPATIC FUNCTION PANEL
ALT: 122 U/L — ABNORMAL HIGH (ref 0–44)
AST: 43 U/L — ABNORMAL HIGH (ref 15–41)
Albumin: 3.8 g/dL (ref 3.5–5.0)
Alkaline Phosphatase: 133 U/L — ABNORMAL HIGH (ref 38–126)
Bilirubin, Direct: <0.1 mg/dL (ref 0.0–0.2)
Total Bilirubin: 0.5 mg/dL (ref 0.3–1.2)
Total Protein: 7.7 g/dL (ref 6.5–8.1)

## 2019-10-21 LAB — BASIC METABOLIC PANEL
Anion gap: 11 (ref 5–15)
BUN: 13 mg/dL (ref 6–20)
CO2: 23 mmol/L (ref 22–32)
Calcium: 9.1 mg/dL (ref 8.9–10.3)
Chloride: 105 mmol/L (ref 98–111)
Creatinine, Ser: 0.6 mg/dL — ABNORMAL LOW (ref 0.61–1.24)
GFR calc Af Amer: >60 mL/min (ref >60)
GFR calc non Af Amer: >60 mL/min (ref >60)
Glucose, Bld: 118 mg/dL — ABNORMAL HIGH (ref 70–99)
Potassium: 3.7 mmol/L (ref 3.5–5.1)
Sodium: 139 mmol/L (ref 135–145)

## 2019-10-21 LAB — TROPONIN I (HIGH SENSITIVITY)
Troponin I (High Sensitivity): <2 ng/L (ref <18)
Troponin I (High Sensitivity): 3 ng/L (ref <18)

## 2019-10-21 LAB — LIPASE, BLOOD: Lipase: 23 U/L (ref 11–51)

## 2019-10-21 LAB — SARS CORONAVIRUS 2 BY RT PCR (HOSPITAL ORDER, PERFORMED IN ~~LOC~~ HOSPITAL LAB): SARS Coronavirus 2: NEGATIVE

## 2019-10-21 MED ORDER — HYDROMORPHONE HCL 1 MG/ML IJ SOLN
1.0000 mg | Freq: Once | INTRAMUSCULAR | Status: AC
  Administered 2019-10-21: 1 mg via INTRAVENOUS
  Filled 2019-10-21: qty 1

## 2019-10-21 MED ORDER — SODIUM CHLORIDE 0.9 % IV BOLUS
1000.0000 mL | Freq: Once | INTRAVENOUS | Status: AC
  Administered 2019-10-21: 1000 mL via INTRAVENOUS

## 2019-10-21 MED ORDER — SODIUM CHLORIDE 0.9% FLUSH: 3.0000 mL | Freq: Once | INTRAVENOUS | Status: DC

## 2019-10-21 NOTE — ED Notes (Signed)
Patient transported to X-ray via stretcher 

## 2019-10-21 NOTE — ED Notes (Signed)
Pt will need interpreter. Interpreter at bedside.

## 2019-10-21 NOTE — ED Triage Notes (Signed)
Used interpretor. Pt was seen at [facility] on 6/17 for a fall. Is here today due to chest pain, rib pain, fever and dizziness.  Airway intact.

## 2019-10-21 NOTE — ED Provider Notes (Signed)
[facility] EMERGENCY DEPARTMENT Provider Note   CSN: [id number] Arrival date & time: 10/21/19  1525     History Chief Complaint  Patient presents with  . Chest Pain  . Fever    William Escobar is a 39 y.o. male.  HPI 39 year old male presents with a chief complaint of right-sided chest pain.  He states he fell 8 days ago and was seen at [facility].  History is obtained with the video [place] interpreter.  The patient states he was seen and admitted and told he had 3 rib fractures on the right side.  Mentions something about a liver injury.  Also injured his knee.  States he never had any types of surgeries.  Was given pain medicine but does not know the name of it.  He states it makes him sleepy.  He is still having pain in his ribs and has a hard time breathing.  He feels short of breath though he denies cough.  No fevers.  He states the pain seems to be worsening which is why he presented today.  Had a subjective fever along with chills yesterday.  Pain is currently severe.  History reviewed. No pertinent past medical history.  There are no problems to display for this patient.   History reviewed. No pertinent surgical history.     History reviewed. No pertinent family history.  Social History   Tobacco Use  . Smoking status: Never Smoker  . Smokeless tobacco: Never Used  Substance Use Topics  . Alcohol use: Yes  . Drug use: Yes    Types: Marijuana    Home Medications Prior to Admission medications   Medication Sig Start Date End Date Taking? Authorizing Provider  acetaminophen (TYLENOL) 325 MG tablet Take 2 tablets (650 mg total) by mouth every 6 (six) hours as needed for mild pain, moderate pain, fever or headache. 04/16/19   Sherwood Gambler, MD  ibuprofen (ADVIL) 600 MG tablet Take 1 tablet (600 mg total) by mouth every 8 (eight) hours as needed for fever, headache, mild pain or moderate pain. 04/16/19   Sherwood Gambler, MD  OVER  THE COUNTER MEDICATION OTC cold medication over the past few days for symptoms    [provider]    Allergies    Patient has no known allergies.  Review of Systems   Review of Systems  Constitutional: Positive for chills and fever.  Respiratory: Positive for shortness of breath. Negative for cough.   Cardiovascular: Positive for chest pain.  Gastrointestinal: Positive for abdominal pain.  Musculoskeletal: Positive for arthralgias and back pain.  All other systems reviewed and are negative.   Physical Exam Updated Vital Signs BP 135/87 (BP Location: Right Arm)   Pulse 83   Temp 98.4 F (36.9 C) (Oral)   Resp 18   SpO2 100%   Physical Exam Vitals and nursing note reviewed.  Constitutional:      Appearance: He is well-developed.  HENT:     Head: Normocephalic and atraumatic.     Right Ear: External ear normal.     Left Ear: External ear normal.     Nose: Nose normal.  Eyes:     General:        Right eye: No discharge.        Left eye: No discharge.  Cardiovascular:     Rate and Rhythm: Normal rate and regular rhythm.     Heart sounds: Normal heart sounds.  Pulmonary:     Effort:  Pulmonary effort is normal.     Breath sounds: Normal breath sounds.  Chest:     Chest wall: Tenderness (right chest wall, no deformity) present.     Comments: Has a chest/abdominal binder on Abdominal:     Palpations: Abdomen is soft.     Tenderness: There is abdominal tenderness in the right lower quadrant. There is guarding.  Musculoskeletal:     Cervical back: Neck supple.     Left knee: No swelling, deformity or effusion. Normal range of motion. Tenderness present.  Skin:    General: Skin is warm and dry.  Neurological:     Mental Status: He is alert.  Psychiatric:        Mood and Affect: Mood is not anxious.     ED Results / Procedures / Treatments   Labs (all labs ordered are listed, but only abnormal results are displayed) Labs Reviewed  BASIC METABOLIC PANEL -  Abnormal; Notable for the following components:      Result Value   Glucose, Bld 118 (*)    Creatinine, Ser 0.60 (*)    All other components within normal limits  CBC - Abnormal; Notable for the following components:   Platelets 409 (*)    All other components within normal limits  HEPATIC FUNCTION PANEL - Abnormal; Notable for the following components:   AST 43 (*)    ALT 122 (*)    Alkaline Phosphatase 133 (*)    All other components within normal limits  SARS CORONAVIRUS 2 BY RT PCR (HOSPITAL ORDER, PERFORMED IN [facility] LAB)  LIPASE, BLOOD  TROPONIN I (HIGH SENSITIVITY)  TROPONIN I (HIGH SENSITIVITY)    EKG EKG Interpretation  Date/Time:  Thursday October 21 2019 15:23:12 EDT Ventricular Rate:  105 PR Interval:  128 QRS Duration: 106 QT Interval:  344 QTC Calculation: 454 R Axis:   45 Text Interpretation: Sinus tachycardia Possible Inferior infarct , age undetermined Abnormal ECG Confirmed by Pricilla Loveless [phone]) on 10/21/2019 8:48:28 PM   Radiology DG Chest 2 View  Result Date: 10/21/2019 CLINICAL DATA:  Recent fall with chest pain, initial encounter EXAM: CHEST - 2 VIEW COMPARISON:  04/16/2019 FINDINGS: Cardiac shadow is within normal limits. Mild left basilar atelectasis is seen. No pneumothorax is noted. No sizable effusion is seen. No acute bony abnormality is noted. Chronic compression deformity at the thoracolumbar junction is noted. IMPRESSION: Mild left basilar atelectasis. Electronically Signed   By: Alcide Clever M.D.   On: 10/21/2019 15:59   DG Knee Complete 4 Views Left  Result Date: 10/21/2019 CLINICAL DATA:  Status post fall. EXAM: LEFT KNEE - COMPLETE 4+ VIEW COMPARISON:  None. FINDINGS: No evidence of fracture, dislocation, or joint effusion. No evidence of arthropathy or other focal bone abnormality. Soft tissues are unremarkable. IMPRESSION: Negative. Electronically Signed   By: Aram Candela M.D.   On: 10/21/2019 22:11     Procedures Procedures (including critical care time)  Medications Ordered in ED Medications  sodium chloride flush (NS) 0.9 % injection 3 mL (3 mLs Intravenous Not Given 10/21/19 2027)  HYDROmorphone (DILAUDID) injection 1 mg (1 mg Intravenous Given 10/21/19 2122)  sodium chloride 0.9 % bolus 1,000 mL (0 mLs Intravenous Stopped 10/21/19 2237)    ED Course  I have reviewed the triage vital signs and the nursing notes.  Pertinent labs & imaging results that were available during my care of the patient were reviewed by me and considered in my medical decision making (see chart for  details).    MDM Rules/Calculators/A&P                          Given patient's reported fever and worsening chest/abd pain, will get CT's to rule out acute pneumonia or abscess/infection. Pain much better controlled with dose of IV dilaudid. Care transferred to [facility] with CT's pending.  Final Clinical Impression(s) / ED Diagnoses Final diagnoses:  None    Rx / DC Orders ED Discharge Orders    None       Pricilla Loveless, MD 10/22/19 0000

## 2019-10-22 ENCOUNTER — Emergency Department (HOSPITAL_COMMUNITY): Payer: Self-pay

## 2019-10-22 MED ORDER — DOXYCYCLINE HYCLATE 100 MG PO CAPS: 100.0000 mg | ORAL_CAPSULE | Freq: Two times a day (BID) | ORAL | 0 refills | Status: AC

## 2019-10-22 MED ORDER — IOHEXOL 350 MG/ML SOLN
100.0000 mL | Freq: Once | INTRAVENOUS | Status: AC | PRN
  Administered 2019-10-22: 100 mL via INTRAVENOUS

## 2019-10-22 MED ORDER — OXYCODONE-ACETAMINOPHEN 5-325 MG PO TABS: 1.0000 | ORAL_TABLET | ORAL | 0 refills | Status: AC | PRN

## 2019-10-22 MED ORDER — OXYCODONE-ACETAMINOPHEN 5-325 MG PO TABS
1.0000 | ORAL_TABLET | Freq: Once | ORAL | Status: AC
  Administered 2019-10-22: 1 via ORAL
  Filled 2019-10-22: qty 1

## 2019-10-22 MED ORDER — DOXYCYCLINE HYCLATE 100 MG PO TABS
100.0000 mg | ORAL_TABLET | Freq: Once | ORAL | Status: AC
  Administered 2019-10-22: 100 mg via ORAL
  Filled 2019-10-22: qty 1

## 2019-10-22 NOTE — ED Provider Notes (Signed)
Care handoff received from Dr. Regenia Skeeter at shift change, please see previous providers note for full details of visit.  In short 39 year old male fell 8 days ago and was seen at [facility].  Patient was admitted after trauma scans and discharged the following day.  He reports continued rib pain and hard time breathing.  Additionally with subjective fevers and chills.  Patient's pain was controlled.  Plan of care at shift change is to follow-up on CT angio chest and CT abdomen pelvis.  Physical Exam  BP 116/81 (BP Location: Right Arm)   Pulse 85   Temp 98.4 F (36.9 C) (Oral)   Resp 18   SpO2 99%   Physical Exam Constitutional:      General: He is not in acute distress.    Appearance: Normal appearance. He is well-developed. He is not ill-appearing or diaphoretic.  HENT:     Head: Normocephalic and atraumatic.  Eyes:     General: Vision grossly intact. Gaze aligned appropriately.     Pupils: Pupils are equal, round, and reactive to light.  Neck:     Trachea: Trachea and phonation normal.  Pulmonary:     Effort: Pulmonary effort is normal. No respiratory distress.  Musculoskeletal:        General: Normal range of motion.     Cervical back: Normal range of motion.  Skin:    General: Skin is warm and dry.  Neurological:     Mental Status: He is alert.     GCS: GCS eye subscore is 4. GCS verbal subscore is 5. GCS motor subscore is 6.     Comments: Speech is clear and goal oriented, follows commands Major Cranial nerves without deficit, no facial droop Moves extremities without ataxia, coordination intact  Psychiatric:        Behavior: Behavior normal.     ED Course/Procedures     Procedures  MDM  Additional History Obtained: 1. Nursing notes from this visit. 2. Family, patient's wife at bedside during my evaluation.  She reports patient fell off a ladder while working 8 days ago, was admitted to [facility] for 1 day.  She has brought some AVS  paperwork.  I reviewed patient's AVS, it was from October 14, 2019.  CT scan of the chest abdomen and pelvis revealed acute grade 3 hepatic injury with poorly defined intraparenchymal hematoma within the inferior right hepatic lobe with focus of active bleeding.  Acute right adrenal hemorrhage.  Acute right axillary hematoma.  Dependent bilateral airspace consolidations reflecting contusion, aspiration or atelectasis.  Small volume of acute blood products within the pelvis.  Mildly displaced fracture of the right ninth rib anteriorly.  He was discharged with Percocet, gabapentin, MiraLAX, Tylenol and Flexeril.  Patient's previous visit is unavailable through EMR system. - Reviewed work-up today: Covid test negative. Lipase within normal limits doubt pancreatitis. Two troponins within normal limits doubt ACS. LFTs minimally elevated suspect secondary to traumatic liver injury. CBC shows no leukocytosis or evidence of anemia. BMP shows no emergent electrolyte derangement, evidence of acute kidney injury or gap.  EKG: Sinus tachycardia Possible Inferior infarct , age undetermined Abnormal ECG Confirmed by Sherwood Gambler [phone]) on 10/21/2019 8:48:28 PM  CXR:  IMPRESSION:  Mild left basilar atelectasis  I personally reviewed patient's chest x-ray and agree with radiologist interpretation  DG Left Knee:  IMPRESSION:  Negative.  I personally viewed left knee x-ray, no obvious fracture or dislocation.  CT angio chest:    IMPRESSION:  1. No pulmonary embolus.  2. Consolidations within dependent lower lobes and lingula, may  represent atelectasis or pneumonia.  3. Acute fracture of anterior most right third rib the costochondral  junction. Mild T7 superior endplate compression fracture appears  acute.  4. Patchy subcutaneous edema in the right lateral chest wall.   CT AP:  IMPRESSION:  1. Intraparenchymal hematoma consistent with grade 2 hepatic injury.  No active extravasation.  2.  Right adrenal hemorrhage without active extravasation. Possible  minimal right renal contusion, grade 1 injury.  3. Acute displaced right ninth rib fracture.  4. L1 compression fracture with approximately 50% loss of height is  likely acute. Remote T12 superior endplate compression fracture.  = Suspect injuries seen on CT scan today are secondary to his fall 8 days ago.  Patient has no history of new trauma.  He is well-appearing and in no acute distress on examination.  Afebrile no tachycardia hypoxia or tachypnea on room air.  Concern patient may be developing pneumonia based on tactile fevers at home along with cough and pain, possibly secondary to him guarding.  There is no indication for admission at this time.  We will treat with doxycycline, incentive spirometer and have him follow-up with primary care doctor for recheck.  Discussed case and plan of care with Dr. Elesa Massed who agrees.  PDMP reviewed, patient has no previous narcotic prescriptions in the database.  He is out of the pain medication given to him last week, will prescribe patient 6 pills of Percocet for his pain secondary to trauma.  Patient and wife state understanding of narcotic precautions.  On reassessment patient well-appearing no acute distress walking around the room without assistance.  Patient and wife report they have a follow-up visit with PCP on Thursday, they are also aware that referral was given to orthopedist today concerning multiple fractures and knee pain.  At this time there does not appear to be any evidence of an acute emergency medical condition and the patient appears stable for discharge with appropriate outpatient follow up. Diagnosis was discussed with patient who verbalizes understanding of care plan and is agreeable to discharge. I have discussed return precautions with patient and wife who verbalizes understanding. Patient encouraged to follow-up with their PCP. All questions answered.  Note: Portions of this  report may have been transcribed using voice recognition software. Every effort was made to ensure accuracy; however, inadvertent computerized transcription errors may still be present.       Bill Salinas, PA-C 10/25/19 1901    Ward, Layla Maw, DO 10/28/19 1513

## 2019-10-22 NOTE — Discharge Instructions (Addendum)
At this time there does not appear to be the presence of an emergent medical condition, however there is always the potential for conditions to change. Please read and follow the below instructions.  Please return to the Emergency Department immediately for any new or worsening symptoms. Please be sure to follow up with your Primary Care Provider within one week regarding your visit today; please call their office to schedule an appointment even if you are feeling better for a follow-up visit. You may use the pain medication Percocet (Oxycodone/Acetaminophen) as prescribed for severe pain.  Percocet will make you drowsy so do not drive, drink alcohol, take other sedating medications or perform any dangerous activities such as driving after taking Percocet.  Percocet contains Tylenol (acetaminophen) so do not take any other medications containing Tylenol with Percocet. You may use the Lidoderm patch as prescribed to help with your symptoms.  Lidoderm may be expensive so you may speak with your pharmacist about finding over-the-counter medications that work similarly. Please take your antibiotic Doxycycline as prescribed until complete to help with your symptoms.  Please drink enough water to avoid dehydration and get plenty of rest. Please continue using the incentive spirometer to help with your symptoms and improve lung function.  Get help right away if: You are short of breath and it gets worse. You have more chest pain. Your sickness gets worse. This is very serious if: You are an older adult. Your body's defense system is weak. You cough up blood. You have any new/concerning or worsening of symptoms  Please read the additional information packets attached to your discharge summary.  Do not take your medicine if  develop an itchy rash, swelling in your mouth or lips, or difficulty breathing; call 911 and seek immediate emergency medical attention if this occurs.  You may review your lab  tests and imaging results in their entirety on your MyChart account.  Please discuss all results of fully with your primary care provider and other specialist at your follow-up visit.  Note: Portions of this text may have been transcribed using voice recognition software. Every effort was made to ensure accuracy; however, inadvertent computerized transcription errors may still be present. ----- Below has been translated using Google translate.  Errors may be present.  Interpret with caution.  A continuacin se ha traducido con Soil scientist. Puede haber errores. Interprete con precaucin. ---- En este momento no parece haber la presencia de una afeccin mdica emergente, sin embargo, siempre existe la posibilidad de que las afecciones [place]. Lea y siga las instrucciones a continuacin.  1. Regrese al Departamento de Emergencias de inmediato si tiene sntomas nuevos o que [place]. 2. Asegrese de hacer un seguimiento con su Proveedor de atencin primaria dentro de una semana con respecto a su visita de hoy; llame a su oficina para programar una cita, incluso si se siente mejor para una visita de seguimiento. 3. Puede usar Chartered loss adjuster (oxicodona / acetaminofn) segn lo prescrito para el dolor intenso. Percocet le dar sueo, as que no conduzca, beba alcohol, tome otros medicamentos sedantes o realice actividades peligrosas como conducir despus de Electrical engineer. Percocet contiene Tylenol (acetaminofn), por lo que no tome ningn otro medicamento que contenga Tylenol con Percocet. 4. Puede usar el parche de Lidoderm segn lo prescrito para ayudar con sus sntomas. El lidoderm puede ser [facility], por lo que puede hablar con su farmacutico sobre cmo Clinical research associate medicamentos de venta libre que funcionen de [place] similar. 5. Por favor, tome  su antibitico Doxiciclina segn lo prescrito hasta completarlo para ayudar con sus sntomas. Beba suficiente agua para evitar la deshidratacin  y descanse lo suficiente. 6. Contine usando el espirmetro incentivador para ayudarlo con sus sntomas y mejorar la funcin pulmonar.  Obtenga ayuda de inmediato si: 1. Tiene dificultad para respirar y [place]. 2. Tiene ms dolor en el pecho. 3. Su enfermedad empeora. Esto es muy grave si: o Es un adulto mayor. o El sistema de defensa de su cuerpo es dbil. 4. Tose sangre. 5. Tiene sntomas nuevos, preocupantes o que empeoran  Lea los paquetes de informacin adicional adjuntos a su resumen de alta.  No tome su medicamento si desarrolla un sarpullido con picazn, hinchazn en su boca o labios, o dificultad para respirar; Llame al 911 y busque atencin mdica de emergencia inmediata si esto ocurre.  Puede revisar sus pruebas de laboratorio y los resultados de las imgenes en su totalidad en su cuenta MyChart. Por favor, analice todos los resultados con su proveedor de atencin primaria y Dietitian en su [place].  Nota: Es posible que algunas partes de este texto se hayan transcrito con un software de reconocimiento de voz. Se hizo todo lo posible para garantizar la precisin; sin embargo, an Barista errores de transcripcin computarizados inadvertidos.

## 2019-10-29 ENCOUNTER — Ambulatory Visit: Payer: Self-pay | Admitting: Orthopaedic Surgery

## 2019-10-29 ENCOUNTER — Encounter: Payer: Self-pay | Admitting: Orthopaedic Surgery

## 2019-10-29 VITALS — Ht 68.0 in | Wt 198.4 lb

## 2019-10-29 DIAGNOSIS — S2249XA Multiple fractures of ribs, unspecified side, initial encounter for closed fracture: Secondary | ICD-10-CM

## 2019-10-29 DIAGNOSIS — M25562 Pain in left knee: Secondary | ICD-10-CM

## 2019-10-29 MED ORDER — IBUPROFEN 800 MG PO TABS
800.0000 mg | ORAL_TABLET | Freq: Three times a day (TID) | ORAL | 2 refills | Status: DC | PRN
Start: 2019-10-29 — End: 2020-05-23

## 2019-10-29 NOTE — Progress Notes (Signed)
Office Visit Note   Patient: William Escobar           Date of Birth: [birth year]           MRN: [id number] Visit Date: 10/29/2019              Requested by: No referring provider defined for this encounter. PCP: Patient, No Pcp Per   Assessment & Plan: Visit Diagnoses:  1. Acute pain of left knee     Plan: Impression is MCL sprain.  We placed him in a hinged knee brace.  We will take him out of work for 4 weeks.  Ibuprofen prescribed.  Referral to general surgery for the rib fracture.  Today's encounter was made more complex by the language barrier.  Follow-Up Instructions: Return in about 4 weeks (around 11/26/2019).   Orders:  No orders of the defined types were placed in this encounter.  Meds ordered this encounter  Medications  . ibuprofen (ADVIL) 800 MG tablet    Sig: Take 1 tablet (800 mg total) by mouth every 8 (eight) hours as needed.    Dispense:  30 tablet    Refill:  2      Procedures: No procedures performed   Clinical Data: No additional findings.   Subjective: Chief Complaint  Patient presents with  . Left Knee - Pain, New Patient (Initial Visit)    [name] is a 39 year old gentleman here with the interpreter for evaluation of acute left knee injury that occurred on 10/13/2019.  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  He sustained rib fractures and was knocked unconscious.  He states that he has a lot of pain when he weightbears on the left leg.  He endorses popping burning numbness tingling and constant pain.  He has been trying to massage as needed to help reduce the pain.   Review of Systems  Constitutional: Negative.   All other systems reviewed and are negative.    Objective: Vital Signs: Ht 5\' 8"  (1.727 m)   Wt 198 lb 6.6 oz (90 kg)   BMI 30.17 kg/m   Physical Exam Vitals and nursing note reviewed.  Constitutional:      Appearance: He is well-developed.  HENT:     Head: Normocephalic and atraumatic.  Eyes:      Pupils: Pupils are equal, round, and reactive to light.  Pulmonary:     Effort: Pulmonary effort is normal.  Abdominal:     Palpations: Abdomen is soft.  Musculoskeletal:        General: Normal range of motion.     Cervical back: Neck supple.  Skin:    General: Skin is warm.  Neurological:     Mental Status: He is alert and oriented to person, place, and time.  Psychiatric:        Behavior: Behavior normal.        Thought Content: Thought content normal.        Judgment: Judgment normal.     Ortho Exam Left knee shows no joint effusion.  ACL feels stable.  He has a tremendous amount of pain along the MCL and with valgus force.  Range of motion is also limited secondary to pain. Specialty Comments:  No specialty comments available.  Imaging: No results found.   PMFS History: There are no problems to display for this patient.  No past medical history on file.  No family history on file.  No past surgical history on file. Social History  Occupational History  . Not on file  Tobacco Use  . Smoking status: Never Smoker  . Smokeless tobacco: Never Used  Substance and Sexual Activity  . Alcohol use: Yes  . Drug use: Yes    Types: Marijuana  . Sexual activity: Not on file

## 2019-10-29 NOTE — Addendum Note (Signed)
Addended by: Albertina Parr on: 10/29/2019 11:41 AM   Modules accepted: Orders

## 2019-11-04 ENCOUNTER — Encounter: Payer: Self-pay | Admitting: Orthopaedic Surgery

## 2019-11-06 ENCOUNTER — Encounter (HOSPITAL_COMMUNITY): Payer: Self-pay

## 2019-11-06 ENCOUNTER — Other Ambulatory Visit: Payer: Self-pay

## 2019-11-06 ENCOUNTER — Emergency Department (HOSPITAL_COMMUNITY)
Admission: EM | Admit: 2019-11-06 | Discharge: 2019-11-06 | Disposition: A | Discharge status: Home / Self Care | Payer: Self-pay | Attending: Emergency Medicine | Admitting: Emergency Medicine

## 2019-11-06 DIAGNOSIS — Z79899 Other long term (current) drug therapy: Secondary | ICD-10-CM | POA: Insufficient documentation

## 2019-11-06 DIAGNOSIS — R0789 Other chest pain: Secondary | ICD-10-CM | POA: Insufficient documentation

## 2019-11-06 DIAGNOSIS — S2231XS Fracture of one rib, right side, sequela: Secondary | ICD-10-CM

## 2019-11-06 DIAGNOSIS — Y990 Civilian activity done for income or pay: Secondary | ICD-10-CM

## 2019-11-06 DIAGNOSIS — R079 Chest pain, unspecified: Secondary | ICD-10-CM

## 2019-11-06 DIAGNOSIS — W19XXXD Unspecified fall, subsequent encounter: Secondary | ICD-10-CM | POA: Insufficient documentation

## 2019-11-06 LAB — CBC
HCT: 45.8 % (ref 39.0–52.0)
Hemoglobin: 15 g/dL (ref 13.0–17.0)
MCH: 29 pg (ref 26.0–34.0)
MCHC: 32.8 g/dL (ref 30.0–36.0)
MCV: 88.6 fL (ref 80.0–100.0)
Platelets: 294 10*3/uL (ref 150–400)
RBC: 5.17 MIL/uL (ref 4.22–5.81)
RDW: 12.2 % (ref 11.5–15.5)
WBC: 6.4 10*3/uL (ref 4.0–10.5)
nRBC: 0 % (ref 0.0–0.2)

## 2019-11-06 LAB — TROPONIN I (HIGH SENSITIVITY)
Troponin I (High Sensitivity): 2 ng/L (ref <18)
Troponin I (High Sensitivity): <2 ng/L (ref <18)

## 2019-11-06 LAB — BASIC METABOLIC PANEL
Anion gap: 13 (ref 5–15)
BUN: 13 mg/dL (ref 6–20)
CO2: 23 mmol/L (ref 22–32)
Calcium: 9.6 mg/dL (ref 8.9–10.3)
Chloride: 103 mmol/L (ref 98–111)
Creatinine, Ser: 0.64 mg/dL (ref 0.61–1.24)
GFR calc Af Amer: >60 mL/min (ref >60)
GFR calc non Af Amer: >60 mL/min (ref >60)
Glucose, Bld: 129 mg/dL — ABNORMAL HIGH (ref 70–99)
Potassium: 4 mmol/L (ref 3.5–5.1)
Sodium: 139 mmol/L (ref 135–145)

## 2019-11-06 MED ORDER — LIDOCAINE 5 % EX PTCH
1.0000 | MEDICATED_PATCH | CUTANEOUS | Status: DC
  Administered 2019-11-06: 1 via TRANSDERMAL
  Filled 2019-11-06: qty 1

## 2019-11-06 MED ORDER — SODIUM CHLORIDE 0.9% FLUSH: 3.0000 mL | Freq: Once | INTRAVENOUS | Status: DC

## 2019-11-06 NOTE — ED Provider Notes (Signed)
[name] EMERGENCY DEPARTMENT Provider Note   CSN: [id number] Arrival date & time: 11/06/19  0840     History No chief complaint on file.   William Escobar is a 39 y.o. male presents to the ED for evaluation of right-sided chest pain.  This began on June 16.  Patient states he had a fall during work.  He went to [facility] where they admitted him.  He came to [facility] on 6/25 and was told he had a rib fracture and other injuries due to the fall.  Went to orthopedist Dr. Roda Shutters this week for evaluation of left knee pain, was told he had some tendon injuries.  He is wearing a knee sleeve.  States he is back in ED because he is not sure if there is anything else we can do for his pain in the chest.  States he has been laying around and doing nothing at home because any movement, breathing or bending forward to lift things up causes him more pain on the right side of the chest.  Is described as sharp and burning.  He shows me an empty container of Percocet and states these pills were helping him but they also made him too sleepy.  He has an appointment with orthopedist on 7/30.  He was referred to general surgery for this rib pain but does not have an appointment yet.  Denies any new trauma or falls.  Denies any fever.  No cough or phlegm.  No shortness of breath other than hurting with deep breathing.  States he does not feel like he can go back to work and start doing heavy exertional activity.  He thinks he needs a couple more weeks to get better.  HPI     History reviewed. No pertinent past medical history.  There are no problems to display for this patient.   History reviewed. No pertinent surgical history.     No family history on file.  Social History   Tobacco Use  . Smoking status: Never Smoker  . Smokeless tobacco: Never Used  Substance Use Topics  . Alcohol use: Yes  . Drug use: Yes    Types: Marijuana    Home Medications Prior to Admission  medications   Medication Sig Start Date End Date Taking? Authorizing Provider  acetaminophen (TYLENOL) 325 MG tablet Take 2 tablets (650 mg total) by mouth every 6 (six) hours as needed for mild pain, moderate pain, fever or headache. 04/16/19   Pricilla Loveless, MD  ibuprofen (ADVIL) 600 MG tablet Take 1 tablet (600 mg total) by mouth every 8 (eight) hours as needed for fever, headache, mild pain or moderate pain. 04/16/19   Pricilla Loveless, MD  ibuprofen (ADVIL) 800 MG tablet Take 1 tablet (800 mg total) by mouth every 8 (eight) hours as needed. 10/29/19   Tarry Kos, MD  OVER THE COUNTER MEDICATION OTC cold medication over the past few days for symptoms    [provider]  oxyCODONE-acetaminophen (PERCOCET/ROXICET) 5-325 MG tablet Take 1-2 tablets by mouth every 4 (four) hours as needed for severe pain. 10/22/19   Bill Salinas, PA-C    Allergies    Patient has no known allergies.  Review of Systems   Review of Systems  Cardiovascular: Positive for chest pain (right chest).  All other systems reviewed and are negative.   Physical Exam Updated Vital Signs BP 131/82 (BP Location: Right Arm)   Pulse 94   Temp 98.2 F (  36.8 C) (Oral)   Resp 20   SpO2 98%   Physical Exam Vitals and nursing note reviewed.  Constitutional:      General: He is not in acute distress.    Appearance: He is well-developed.     Comments: NAD.  HENT:     Head: Normocephalic and atraumatic.     Right Ear: External ear normal.     Left Ear: External ear normal.     Nose: Nose normal.  Eyes:     General: No scleral icterus.    Conjunctiva/sclera: Conjunctivae normal.  Cardiovascular:     Rate and Rhythm: Normal rate and regular rhythm.     Heart sounds: Normal heart sounds. No murmur heard.   Pulmonary:     Effort: Pulmonary effort is normal.     Breath sounds: Normal breath sounds. No wheezing.     Comments: Speaking in full sentences. Chest:     Chest wall: Tenderness (right upper  chest around nipple level and above) present.  Musculoskeletal:        General: No deformity. Normal range of motion.     Cervical back: Normal range of motion and neck supple.  Skin:    General: Skin is warm and dry.     Capillary Refill: Capillary refill takes less than 2 seconds.  Neurological:     Mental Status: He is alert and oriented to person, place, and time.  Psychiatric:        Behavior: Behavior normal.        Thought Content: Thought content normal.        Judgment: Judgment normal.     ED Results / Procedures / Treatments   Labs (all labs ordered are listed, but only abnormal results are displayed) Labs Reviewed  BASIC METABOLIC PANEL - Abnormal; Notable for the following components:      Result Value   Glucose, Bld 129 (*)    All other components within normal limits  CBC  TROPONIN I (HIGH SENSITIVITY)  TROPONIN I (HIGH SENSITIVITY)    EKG None  Radiology No results found.  Procedures Procedures (including critical care time)  Medications Ordered in ED Medications  sodium chloride flush (NS) 0.9 % injection 3 mL (0 mLs Intravenous Hold 11/06/19 1056)  lidocaine (LIDODERM) 5 % 1 patch (has no administration in time range)    ED Course  I have reviewed the triage vital signs and the nursing notes.  Pertinent labs & imaging results that were available during my care of the patient were reviewed by me and considered in my medical decision making (see chart for details).    MDM Rules/Calculators/A&P                         39 year old patient presents for ongoing right-sided chest wall pain.  Had a mechanical fall during work last month June 16.  He tells me he went to [facility] where he was admitted for 1 day.  Return to the ED here at [facility] on 6/25 for further evaluation.  I reviewed patient's medical records, nursing and triage notes.  His ED visit from 6/25 was reviewed.  In summary, he had a CT angio PE, CT abdomen and pelvis which revealed several  injuries including grade 3 hepatic injury, right adrenal hemorrhage, right axillary hematoma, mildly displaced fracture of the right third rib, mildly displaced 9th rib fracture, acute L1 compression fracture. Seen by Dr Roda Shutters on 7/2 recommended knee sleeve,  ibuprofen, referred to general surgery for rib fracture.  He was discharged with doxycycline, Percocet and IS.  ER work-up initiated by triage RN including chest pain focused work-up including labs, troponin and EKG.  Chest x-ray was not ordered.  I have personally visualized and interpreted these and nonacute.  Troponins x2 undetectable.  EKG without ischemia, right ventricular strain.  Patient's presentation is not consistent with ACS.  Overall he tells me his pain has been very slowly improving but still present.  He ran out of Percocet recently.  Denies red flags like fever, cough, shortness of breath or worsening pain.  No new trauma.  Exam is benign.  Afebrile.  Normal SPO2.  Discussed with patient he will likely have pain for up to 48 weeks which will be gradually improving.  I do not think further emergent lab work or imaging is indicated since he has no new trauma and overall his pain has slowly improved.  Doubt superimposed infection.  Doubt PE, ACS, dissection.   Will DC with lidocaine patches, high-dose NSAIDs, I-S.  I have written him a work note for 1 more week.  Recommend that he establish care with a PCP in case he needs formal Workmen's Comp, medical leave from work.  Return precautions given.   third third rib Final Clinical Impression(s) / ED Diagnoses Final diagnoses:  Right-sided chest pain  Work related injury  Closed fracture of one rib of right side, sequela    Rx / DC Orders ED Discharge Orders    None       Jerrell Mylar 11/06/19 1200    Linwood Dibbles, MD 11/06/19 1708

## 2019-11-06 NOTE — ED Triage Notes (Addendum)
Patient complains of ongoing right sided chest and right sided rib pain from fall 1 month ago. Has been seen for same here and [facility]. Pain with inspiration and movement. Denies new trauma. Now complains of pain to left chest and back

## 2019-11-06 NOTE — Discharge Instructions (Addendum)
Take 600 mg ibuprofen AND 1000 mg acetaminophen every 6 hours for pain.    Over the counter Lidocaine 4% patch as needed for more pain control.  Return for fever, worsening pain, cough, phlegm  Work note given for 1 more week.  Please make appointment with a primary care doctor to obtain work note for longer period of time or determine if you need work related compensation or more medical leave

## 2019-11-09 ENCOUNTER — Encounter: Payer: Self-pay | Admitting: Orthopaedic Surgery

## 2019-11-11 NOTE — Telephone Encounter (Signed)
Okay.  I guess he does not need an appointment then.

## 2019-11-12 NOTE — Telephone Encounter (Signed)
I cancelled the referral to [facility].

## 2019-11-26 ENCOUNTER — Encounter: Payer: Self-pay | Admitting: Orthopaedic Surgery

## 2019-11-26 ENCOUNTER — Ambulatory Visit (INDEPENDENT_AMBULATORY_CARE_PROVIDER_SITE_OTHER): Payer: Self-pay | Admitting: Orthopaedic Surgery

## 2019-11-26 DIAGNOSIS — R519 Headache, unspecified: Secondary | ICD-10-CM

## 2019-11-26 DIAGNOSIS — M25562 Pain in left knee: Secondary | ICD-10-CM

## 2019-11-26 NOTE — Progress Notes (Addendum)
   Patient: William Escobar           Date of Birth: 1980/07/04           MRN: [id number] Visit Date: 11/26/2019 PCP: Patient, No Pcp Per   Assessment & Plan:  Visit Diagnoses:  1. Acute pain of left knee   2. Nonintractable headache, unspecified chronicity pattern, unspecified headache type     Plan: [name] returns today for follow-up of his left knee pain MCL sprain.  He states that overall he is doing better.  He has some discomfort with straightening out his leg.  He has some pain when he is going down stairs.  The knee brace has helped tremendously.  He is currently out of work.  He has some mild swelling on the medial side of his medial femoral condyle.  Tenderness along the MCL which has improved.  Mild discomfort with valgus force of the knee.  No joint effusion.  Overall I feel that he is improving as expected.  We will make referral to outpatient PT at this point.  I think at this point he can continue his care with PT and rehab with them.  He can wean his knee brace as tolerated.  He knows that he can follow-up with [place] at any time if he has any questions.  We will make a referral to PCP for headache and numbness in his head as a result of the injury.  Follow-up as needed.  Follow-Up Instructions: Return if symptoms worsen or fail to improve.   Orders:  Orders Placed This Encounter  Procedures  . Ambulatory referral to Physical Therapy  . Ambulatory referral to [facility]   No orders of the defined types were placed in this encounter.   Imaging: No results found.  PMFS History: There are no problems to display for this patient.  History reviewed. No pertinent past medical history.  History reviewed. No pertinent family history.  History reviewed. No pertinent surgical history. Social History   Occupational History  . Not on file  Tobacco Use  . Smoking status: Never Smoker  . Smokeless tobacco: Never Used  Substance and Sexual Activity  . Alcohol use:  Yes  . Drug use: Yes    Types: Marijuana  . Sexual activity: Not on file

## 2019-11-26 NOTE — Addendum Note (Signed)
Addended by: Mayra Reel on: 11/26/2019 09:31 AM   Modules accepted: Level of Service

## 2019-12-02 ENCOUNTER — Ambulatory Visit: Payer: Self-pay | Attending: Orthopaedic Surgery | Admitting: Physical Therapy

## 2019-12-02 ENCOUNTER — Encounter: Payer: Self-pay | Admitting: Physical Therapy

## 2019-12-02 ENCOUNTER — Other Ambulatory Visit: Payer: Self-pay

## 2019-12-02 ENCOUNTER — Encounter: Payer: Self-pay | Admitting: Orthopaedic Surgery

## 2019-12-02 DIAGNOSIS — R600 Localized edema: Secondary | ICD-10-CM | POA: Insufficient documentation

## 2019-12-02 DIAGNOSIS — S83412A Sprain of medial collateral ligament of left knee, initial encounter: Secondary | ICD-10-CM | POA: Insufficient documentation

## 2019-12-02 DIAGNOSIS — M25562 Pain in left knee: Secondary | ICD-10-CM | POA: Insufficient documentation

## 2019-12-02 NOTE — Therapy (Addendum)
[facility] [address] [place], [place], [address] Phone: [phone]   Fax:  [phone]  Physical Therapy Evaluation  Patient Details  Name: William Escobar MRN: [id number] Date of Birth: 04/05/1981 Referring Provider (PT): Dr. Gershon MusselNaiping Xu    Encounter Date: 12/02/2019   PT End of Session - 12/02/19 1331    Visit Number 1    Number of Visits 6    PT Start Time 1100    PT Stop Time 1143    PT Time Calculation (min) 43 min    Activity Tolerance Patient tolerated treatment well;Patient limited by pain    Behavior During Therapy [facility] for tasks assessed/performed   continued to complain about ribs and head          History reviewed. No pertinent past medical history.  History reviewed. No pertinent surgical history.  There were no vitals filed for this visit.    Subjective Assessment - 12/02/19 1103    Subjective Pt is a 39 y.o. male c/o L knee pain. 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 He fell over the scaffold which caused the knee pain. Pt states he is unable to straighten his knee or bend it very much. At night he sleeps with a pillow under his knee. He was unconscious after fall; still has headahces, dizziness, nausea, difficulty sleeping, and memory issues. His eye feels like it is throbbing. Pt reports multiple rib fractures which still cause significant pain. Pt states it hurts to flex and DF his foot.    Patient is accompained by: Interpreter    Pertinent History probably concussion and rib fx    Limitations Standing;Walking;Sitting;Lifting;House hold activities   stairs   How long can you sit comfortably? 5 minutes    How long can you stand comfortably? 5 minutes    How long can you walk comfortably? uses brace to walk    Diagnostic tests xray    Patient Stated Goals decrease pain, be 100%, get back to work    Currently in Pain? Yes    Pain Score 8     Pain Location Knee     Pain Orientation Left    Pain Descriptors / Indicators Aching    Pain Type Acute pain    Pain Onset More than a month ago    Pain Frequency Intermittent    Aggravating Factors  walking, sitting for a long time, standing    Pain Relieving Factors meds    Effect of Pain on Daily Activities unable to work              [facility] - 12/02/19 0001      Assessment   Medical Diagnosis L medial meniscus sprain    Referring Provider (PT) Dr. Gershon MusselNaiping Xu     Onset Date/Surgical Date --   June 2021   Next MD Visit nothing scheduled    Prior Therapy no      Precautions   Precautions None    Precaution Comments concussive symptoms      Restrictions   Weight Bearing Restrictions No      Balance Screen   Has the patient fallen in the past 6 months Yes    How many times? 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   Overall Cognitive Status Within Functional Limits for tasks assessed    Attention Focused    Memory Appears intact    Awareness Appears intact    Problem Solving Appears intact      Observation/Other Assessments   Observations swelling along medial L knee      Sensation   Light Touch Appears Intact    Proprioception Appears Intact      Coordination   Gross Motor Movements are Fluid and Coordinated Yes    Fine Motor Movements are Fluid and Coordinated Yes      ROM / Strength   AROM / PROM / Strength AROM;PROM;Strength      AROM   AROM Assessment Site Knee    Right/Left Knee Left    Left Knee Extension 2    Left Knee Flexion 66      PROM   PROM Assessment Site Knee    Right/Left Knee Left      Strength   Overall Strength Comments not able to perform MMT due to  significant pain    Strength Assessment Site Hip;Knee      Palpation   Palpation comment highly TTP on medial knee in area of medial meniscus                       Objective measurements completed on examination: See above findings.       [facility] Adult PT Treatment/Exercise - 12/02/19 0001      Self-Care   Self-Care Other Self-Care Comments    Other Self-Care Comments  Discussed contacting referring provider about headache and other neuro sx from fall. Avoiding any heavy lifting for the time being until knee, ribs, and concussion sypmtoms are gone.       Exercises   Exercises Knee/Hip      Knee/Hip Exercises: Supine   Quad Sets 10 reps      Modalities   Modalities Cryotherapy      Cryotherapy   Number Minutes Cryotherapy 10 Minutes    Cryotherapy Location Knee   Left   Type of Cryotherapy Ice pack                  PT Education - 12/02/19 1330    Education Details HEP, ice at home, calling MD to set up appointment with neurologist, healing time for rib fx, A&P of MCL sprain    Person(s) Educated Patient    Methods Explanation;Demonstration;Tactile cues;Verbal cues    Comprehension Tactile cues required;Returned demonstration;Verbal cues required;Verbalized understanding            PT Short Term Goals - 12/02/19 1351      PT SHORT TERM GOAL #1   Title Pt will increase knee extension to 0 degrees AROM.    Time 3    Period Weeks    Status New    Target Date 12/23/19      PT SHORT TERM GOAL #2   Title Pt will increase knee flexion ROM to 100 degrees AROM.    Time 3    Period Weeks    Status New    Target Date 12/23/19      PT SHORT TERM GOAL #3   Title Pt will exhibit decreased TTP in his knee so MMT can  be performed to assess LE strength.    Time 3    Period Weeks    Status New    Target Date 12/23/19      PT SHORT TERM GOAL #4   Title Pt will demonstrate proper use and wear of knee brace to provide him the most stability.    Time 3     Period Weeks    Status New    Target Date 12/23/19             PT Long Term Goals - 12/02/19 1353      PT LONG TERM GOAL #1   Title Pt will be able to ascend and descend 10 stairs with proper mechanics and no complaints of pain.    Time 6    Period Weeks    Status New    Target Date 01/13/20      PT LONG TERM GOAL #2   Title Pt will be able to squat 25 lbs with proper mechanics to be able to go back to work.    Time 6    Period Weeks    Status New    Target Date 01/13/20      PT LONG TERM GOAL #3   Title Pt will be able to perform 30 sec SLS on L leg indicating increased stability.    Time 6    Period Weeks    Status New    Target Date 01/13/20      PT LONG TERM GOAL #4   Title Pt will be able to ambulate 1000' with proper gait mechanics and no reports of pain to be able to return to work.    Time 6    Period Weeks    Status New    Target Date 01/13/20                  Plan - 12/02/19 1341    Clinical Impression Statement Pt presents to outpatient PT with L MCL sprain c/o medial knee pain and swelling. He had a fall at work and fractured mulitple ribs, hurt his knee, and likely had a concussion. Pt continues to have headaches, nausea, dizziness, and difficulty sleeping. Pt was highly TTP along the medial L knee. He has decreased knee ROM in flexion and extension. Unable to test strength due to significant knee pain. Therapy discussed contacting neurologist to assess his neck and head pain. Pt would benefit from skilled therapy to improve his knee ROM, LE strength, and knee stability to be able to work and complete IADLs.    Personal Factors and Comorbidities Comorbidity 2    Comorbidities rib fracture, concussion like symptoms   pt's history is not listed. denied any surgeries or pertinent medical info   Examination-Activity Limitations Bend;Lift;Stand;Stairs;Squat;Sit;Locomotion Level    Examination-Participation Restrictions Driving;Community Activity   work    Stability/Clinical Decision Making Evolving/Moderate complexity    Clinical Decision Making Moderate    Rehab Potential Good    PT Frequency 1x / week   1x a week until neuro sx get cleared up   PT Duration 6 weeks    PT Treatment/Interventions ADLs/Self Care Home Management;Electrical Stimulation;Ultrasound;Traction;Moist Heat;Iontophoresis 4mg /ml Dexamethasone;Gait training;Stair training;Functional mobility training;Therapeutic activities;Therapeutic exercise;Balance training;Neuromuscular re-education;Manual techniques;Patient/family education;Passive range of motion;Dry needling;Joint Manipulations;Spinal Manipulations;Vasopneumatic Device;Taping    PT Next Visit Plan instruct proper fit of brace, PROM for flexion and extension, light strengthening    PT Home Exercise Plan quad sets, ice    Consulted and Agree with  Plan of Care Patient           Patient will benefit from skilled therapeutic intervention in order to improve the following deficits and impairments:  Abnormal gait, Decreased range of motion, Difficulty walking, Pain, Decreased strength, Decreased mobility, Increased edema  Visit Diagnosis: Acute pain of left knee  Localized edema  Sprain of medial collateral ligament of left knee, initial encounter     Problem List There are no problems to display for this patient.   Lowell Bouton SPT 12/02/2019, 5:47 PM  [facility] [address] [place], [place], [address] Phone: [phone]   Fax:  [phone]  Name: William Escobar MRN: [id number] Date of Birth: 03-20-1981

## 2019-12-03 ENCOUNTER — Emergency Department (HOSPITAL_COMMUNITY): Payer: Self-pay

## 2019-12-03 ENCOUNTER — Encounter (HOSPITAL_COMMUNITY): Payer: Self-pay | Admitting: Emergency Medicine

## 2019-12-03 ENCOUNTER — Emergency Department (HOSPITAL_COMMUNITY)
Admission: EM | Admit: 2019-12-03 | Discharge: 2019-12-03 | Disposition: A | Discharge status: Home / Self Care | Payer: Self-pay | Attending: Emergency Medicine | Admitting: Emergency Medicine

## 2019-12-03 DIAGNOSIS — F1210 Cannabis abuse, uncomplicated: Secondary | ICD-10-CM | POA: Insufficient documentation

## 2019-12-03 DIAGNOSIS — Z79899 Other long term (current) drug therapy: Secondary | ICD-10-CM | POA: Insufficient documentation

## 2019-12-03 DIAGNOSIS — G510 Bell's palsy: Secondary | ICD-10-CM | POA: Insufficient documentation

## 2019-12-03 DIAGNOSIS — R202 Paresthesia of skin: Secondary | ICD-10-CM | POA: Insufficient documentation

## 2019-12-03 LAB — CBC
HCT: 44.3 % (ref 39.0–52.0)
Hemoglobin: 14.1 g/dL (ref 13.0–17.0)
MCH: 28.4 pg (ref 26.0–34.0)
MCHC: 31.8 g/dL (ref 30.0–36.0)
MCV: 89.3 fL (ref 80.0–100.0)
Platelets: 258 10*3/uL (ref 150–400)
RBC: 4.96 MIL/uL (ref 4.22–5.81)
RDW: 12.7 % (ref 11.5–15.5)
WBC: 6.9 10*3/uL (ref 4.0–10.5)
nRBC: 0 % (ref 0.0–0.2)

## 2019-12-03 LAB — COMPREHENSIVE METABOLIC PANEL
ALT: 63 U/L — ABNORMAL HIGH (ref 0–44)
AST: 26 U/L (ref 15–41)
Albumin: 4 g/dL (ref 3.5–5.0)
Alkaline Phosphatase: 92 U/L (ref 38–126)
Anion gap: 12 (ref 5–15)
BUN: 14 mg/dL (ref 6–20)
CO2: 22 mmol/L (ref 22–32)
Calcium: 9.3 mg/dL (ref 8.9–10.3)
Chloride: 105 mmol/L (ref 98–111)
Creatinine, Ser: 0.63 mg/dL (ref 0.61–1.24)
GFR calc Af Amer: >60 mL/min (ref >60)
GFR calc non Af Amer: >60 mL/min (ref >60)
Glucose, Bld: 104 mg/dL — ABNORMAL HIGH (ref 70–99)
Potassium: 4 mmol/L (ref 3.5–5.1)
Sodium: 139 mmol/L (ref 135–145)
Total Bilirubin: 0.5 mg/dL (ref 0.3–1.2)
Total Protein: 7.3 g/dL (ref 6.5–8.1)

## 2019-12-03 LAB — DIFFERENTIAL
Abs Immature Granulocytes: 0.03 10*3/uL (ref 0.00–0.07)
Basophils Absolute: 0 10*3/uL (ref 0.0–0.1)
Basophils Relative: 0 %
Eosinophils Absolute: 1.2 10*3/uL — ABNORMAL HIGH (ref 0.0–0.5)
Eosinophils Relative: 17 %
Immature Granulocytes: 0 %
Lymphocytes Relative: 34 %
Lymphs Abs: 2.3 10*3/uL (ref 0.7–4.0)
Monocytes Absolute: 0.5 10*3/uL (ref 0.1–1.0)
Monocytes Relative: 8 %
Neutro Abs: 2.8 10*3/uL (ref 1.7–7.7)
Neutrophils Relative %: 41 %

## 2019-12-03 LAB — I-STAT CHEM 8, ED
BUN: 17 mg/dL (ref 6–20)
Calcium, Ion: 1.21 mmol/L (ref 1.15–1.40)
Chloride: 106 mmol/L (ref 98–111)
Creatinine, Ser: 0.5 mg/dL — ABNORMAL LOW (ref 0.61–1.24)
Glucose, Bld: 104 mg/dL — ABNORMAL HIGH (ref 70–99)
HCT: 43 % (ref 39.0–52.0)
Hemoglobin: 14.6 g/dL (ref 13.0–17.0)
Potassium: 4 mmol/L (ref 3.5–5.1)
Sodium: 142 mmol/L (ref 135–145)
TCO2: 24 mmol/L (ref 22–32)

## 2019-12-03 LAB — PROTIME-INR
INR: 1 (ref 0.8–1.2)
Prothrombin Time: 12.3 seconds (ref 11.4–15.2)

## 2019-12-03 LAB — APTT: aPTT: 34 seconds (ref 24–36)

## 2019-12-03 MED ORDER — PREDNISONE 20 MG PO TABS
60.0000 mg | ORAL_TABLET | Freq: Once | ORAL | Status: AC
  Administered 2019-12-03: 60 mg via ORAL
  Filled 2019-12-03: qty 3

## 2019-12-03 MED ORDER — VALACYCLOVIR HCL 500 MG PO TABS
1000.0000 mg | ORAL_TABLET | Freq: Once | ORAL | Status: AC
  Administered 2019-12-03: 1000 mg via ORAL
  Filled 2019-12-03 (×2): qty 2

## 2019-12-03 MED ORDER — GADOBUTROL 1 MMOL/ML IV SOLN
9.0000 mL | Freq: Once | INTRAVENOUS | Status: AC | PRN
  Administered 2019-12-03: 9 mL via INTRAVENOUS

## 2019-12-03 MED ORDER — ARTIFICIAL TEARS OPHTHALMIC OINT
TOPICAL_OINTMENT | Freq: Every evening | OPHTHALMIC | 0 refills | Status: AC | PRN
Start: 2019-12-03 — End: ?

## 2019-12-03 MED ORDER — GADOBUTROL 1 MMOL/ML IV SOLN: 9.0000 mL | Freq: Once | INTRAVENOUS | Status: DC | PRN

## 2019-12-03 MED ORDER — PREDNISONE 20 MG PO TABS: ORAL_TABLET | ORAL | 0 refills | Status: AC

## 2019-12-03 MED ORDER — VALACYCLOVIR HCL 1 G PO TABS
1000.0000 mg | ORAL_TABLET | Freq: Three times a day (TID) | ORAL | 0 refills | Status: AC
Start: 2019-12-03 — End: ?

## 2019-12-03 MED ORDER — SODIUM CHLORIDE 0.9% FLUSH
3.0000 mL | Freq: Once | INTRAVENOUS | Status: AC
  Administered 2019-12-03: 3 mL via INTRAVENOUS

## 2019-12-03 NOTE — ED Triage Notes (Addendum)
Pt reports at around 4pm yesterday he began feeling like the L side of his face was numb and like he could not control the left side of his face. Pt also endorses numbness to his L arm and L leg as well as difficulty with vision to L eye. Pt a/ox4, speech clear, a/ox4. Spanish interpretor utitlized. Hand grips equal, no arm drift noted.
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
## 2019-12-03 NOTE — ED Notes (Signed)
Pt transported to MRI 

## 2019-12-06 ENCOUNTER — Encounter: Payer: Self-pay | Admitting: Internal Medicine

## 2019-12-06 ENCOUNTER — Ambulatory Visit: Payer: Self-pay

## 2019-12-06 ENCOUNTER — Other Ambulatory Visit: Payer: Self-pay

## 2019-12-06 DIAGNOSIS — R600 Localized edema: Secondary | ICD-10-CM

## 2019-12-06 DIAGNOSIS — S83412A Sprain of medial collateral ligament of left knee, initial encounter: Secondary | ICD-10-CM

## 2019-12-06 DIAGNOSIS — M25562 Pain in left knee: Secondary | ICD-10-CM
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
## 2019-12-06 NOTE — Patient Instructions (Signed)
  To complete as tolerated.

## 2019-12-10 ENCOUNTER — Encounter: Payer: Self-pay | Admitting: Neurology

## 2019-12-10 ENCOUNTER — Encounter: Payer: Self-pay | Admitting: Orthopaedic Surgery

## 2019-12-10 NOTE — Telephone Encounter (Signed)
He may do sit down work for another 1 month.  If he's unable to return at that time then he will need reevaluation.

## 2019-12-16 NOTE — Telephone Encounter (Signed)
We cannot change the note that the ED doctor wrote, but we can write new note if needed dealing with ortho complaints

## 2019-12-20 ENCOUNTER — Telehealth: Payer: Self-pay | Admitting: Orthopaedic Surgery

## 2019-12-20 ENCOUNTER — Ambulatory Visit: Payer: Self-pay | Admitting: Physical Therapy

## 2019-12-20 ENCOUNTER — Other Ambulatory Visit: Payer: Self-pay

## 2019-12-20 DIAGNOSIS — S83412A Sprain of medial collateral ligament of left knee, initial encounter: Secondary | ICD-10-CM

## 2019-12-20 DIAGNOSIS — M25562 Pain in left knee: Secondary | ICD-10-CM

## 2019-12-20 DIAGNOSIS — R600 Localized edema: Secondary | ICD-10-CM

## 2019-12-20 NOTE — Therapy (Signed)
[facility] [address] [place], [place], [address] Phone: [phone]   Fax:  [phone]  Physical Therapy Treatment  Patient Details  Name: William Escobar MRN: [id number] Date of Birth: 1980-12-15 Referring Provider (PT): Dr. Gershon Mussel    Encounter Date: 12/20/2019   PT End of Session - 12/20/19 1132    Visit Number 3    Number of Visits 6    Date for PT Re-Evaluation 01/31/20    PT Start Time 1103    PT Stop Time 1146   unable to tolerate ther-ex or manual therapy   PT Time Calculation (min) 43 min    Activity Tolerance Patient tolerated treatment well;Patient limited by pain    Behavior During Therapy [facility] for tasks assessed/performed           No past medical history on file.  No past surgical history on file.  There were no vitals filed for this visit.   Subjective Assessment - 12/20/19 1414    Subjective Patient reports his pain is getting better. he continues to have pain behind his knee though. His knee swells and throbs when he stands and walks. he also reports pain in the right side of his ribs. He has tried not using his brace. he was advised if it helps him stand and walk to continue using it.    Pertinent History probably concussion and rib fx    Limitations Standing;Walking;Sitting;Lifting;House hold activities    How long can you sit comfortably? 5 minutes    How long can you stand comfortably? 5 minutes    How long can you walk comfortably? uses brace to walk    Diagnostic tests xray    Patient Stated Goals decrease pain, be 100%, get back to work    Currently in Pain? Yes    Pain Score 7     Pain Location Knee    Pain Orientation Left    Pain Descriptors / Indicators Aching    Pain Onset More than a month ago    Pain Frequency Constant    Aggravating Factors  walking and sitting for a long period of time    Pain Relieving Factors meds    Effect of Pain on Daily Activities unable to work                                      PT Education - 12/20/19 1416    Education Details reviewed HEP and symptom mangement    Person(s) Educated Patient    Methods Tactile cues;Demonstration;Explanation    Comprehension Verbalized understanding;Returned demonstration;Verbal cues required;Tactile cues required            PT Short Term Goals - 12/02/19 1351      PT SHORT TERM GOAL #1   Title Pt will increase knee extension to 0 degrees AROM.    Time 3    Period Weeks    Status New    Target Date 12/23/19      PT SHORT TERM GOAL #2   Title Pt will increase knee flexion ROM to 100 degrees AROM.    Time 3    Period Weeks    Status New    Target Date 12/23/19      PT SHORT TERM GOAL #3   Title Pt will exhibit decreased TTP in his knee so MMT can be performed to assess LE strength.  Time 3    Period Weeks    Status New    Target Date 12/23/19      PT SHORT TERM GOAL #4   Title Pt will demonstrate proper use and wear of knee brace to provide him the most stability.    Time 3    Period Weeks    Status New    Target Date 12/23/19             PT Long Term Goals - 12/02/19 1353      PT LONG TERM GOAL #1   Title Pt will be able to ascend and descend 10 stairs with proper mechanics and no complaints of pain.    Time 6    Period Weeks    Status New    Target Date 01/13/20      PT LONG TERM GOAL #2   Title Pt will be able to squat 25 lbs with proper mechanics to be able to go back to work.    Time 6    Period Weeks    Status New    Target Date 01/13/20      PT LONG TERM GOAL #3   Title Pt will be able to perform 30 sec SLS on L leg indicating increased stability.    Time 6    Period Weeks    Status New    Target Date 01/13/20      PT LONG TERM GOAL #4   Title Pt will be able to ambulate 1000' with proper gait mechanics and no reports of pain to be able to return to work.    Time 6    Period Weeks    Status New    Target Date  01/13/20                 Plan - 12/20/19 1123    Clinical Impression Statement Overall the patient appears to be progressing. He can stand better and walk better then previous visits. He was limited by pain today.  He feels like he has a ball in the back of his knee. he had low tolerance to manual therapy today. he was gaurding which may have been increasing his pain. He was able to perfrom the nu-step through nearly a full range but had low tolerance to minimal range with maual therapy. He wsas given light standing exercises today to work on at home including weight shifting and a low march. he reported increased pain. He reports he is doing stairs at home but could not tolerate weight shifting or low march for more then a few reps. he was advised to practice at home.Marland Kitchen He continues to have pain straighening his knee. He was wondering when he could go back to work. In order to climb a ladder her will need to be able to straighten his leg. He also had some pain in his ribs while lying down.    Personal Factors and Comorbidities Comorbidity 2    Comorbidities rib fracture, concussion like symptoms    Examination-Activity Limitations Bend;Lift;Stand;Stairs;Squat;Sit;Locomotion Level    Stability/Clinical Decision Making Evolving/Moderate complexity    Clinical Decision Making Moderate    Rehab Potential Good    PT Frequency 1x / week    PT Duration 6 weeks    PT Treatment/Interventions ADLs/Self Care Home Management;Electrical Stimulation;Ultrasound;Traction;Moist Heat;Iontophoresis 4mg /ml Dexamethasone;Gait training;Stair training;Functional mobility training;Therapeutic activities;Therapeutic exercise;Balance training;Neuromuscular re-education;Manual techniques;Patient/family education;Passive range of motion;Dry needling;Joint Manipulations;Spinal Manipulations;Vasopneumatic Device;Taping    PT Next Visit Plan Assess response  to HEP and iontophoresis    PT Home Exercise Plan [id number]: quad  sets c towel under knee, LAQ c ROM for knee flex. Ice pack 10-15 mins every 2 hours as needed.    Consulted and Agree with Plan of Care Patient           Patient will benefit from skilled therapeutic intervention in order to improve the following deficits and impairments:  Abnormal gait, Decreased range of motion, Difficulty walking, Pain, Decreased strength, Decreased mobility, Increased edema  Visit Diagnosis: Acute pain of left knee  Localized edema  Sprain of medial collateral ligament of left knee, initial encounter     Problem List There are no problems to display for this patient.   Dessie Coma PT DPT  12/20/2019, 2:16 PM  [facility] [address] [place], [place], [address] Phone: [phone]   Fax:  [phone]  Name: Zakiah Beckerman MRN: [id number] Date of Birth: [birth year]

## 2019-12-20 NOTE — Telephone Encounter (Signed)
Patient walked into office requesting to speak with William Escobar(pt speaks Spanish). He's wanting a work note that states he's unable to work at this time.

## 2019-12-21 NOTE — Telephone Encounter (Signed)
Yes for 6 weeks

## 2019-12-21 NOTE — Telephone Encounter (Signed)
Note made. Patient aware.

## 2019-12-22 NOTE — Telephone Encounter (Signed)
Can we see what she needs [place] to write?

## 2019-12-23 NOTE — Telephone Encounter (Signed)
See message f rom patient

## 2019-12-24 NOTE — Telephone Encounter (Signed)
This is ok to do

## 2019-12-27 ENCOUNTER — Other Ambulatory Visit: Payer: Self-pay

## 2019-12-27 ENCOUNTER — Ambulatory Visit: Payer: Self-pay | Admitting: Physical Therapy

## 2019-12-27 ENCOUNTER — Encounter: Payer: Self-pay | Admitting: Physical Therapy

## 2019-12-27 DIAGNOSIS — R600 Localized edema: Secondary | ICD-10-CM

## 2019-12-27 DIAGNOSIS — S83412A Sprain of medial collateral ligament of left knee, initial encounter: Secondary | ICD-10-CM

## 2019-12-27 DIAGNOSIS — M25562 Pain in left knee: Secondary | ICD-10-CM

## 2019-12-27 NOTE — Therapy (Signed)
[place], [place], [address] Phone: [phone]   Fax:  [phone]  Physical Therapy Treatment  Patient Details  Name: William Escobar MRN: [id number] Date of Birth: August 09, 1980 Referring Provider (PT): Dr. Frankey Shown    Encounter Date: 12/27/2019   PT End of Session - 12/27/19 1350    Visit Number 4    Number of Visits 6    Date for PT Re-Evaluation 01/31/20    PT Start Time 1100    PT Stop Time 1141    PT Time Calculation (min) 41 min    Activity Tolerance Patient tolerated treatment well;Patient limited by pain           History reviewed. No pertinent past medical history.  History reviewed. No pertinent surgical history.  There were no vitals filed for this visit.   Subjective Assessment - 12/27/19 1342    Subjective Patient contines to report improvement in pain. He is still having pain though. He has pain flexion and extending his knee. He has been working on his exercsies.    Patient is accompained by: Interpreter    Pertinent History probably concussion and rib fx    Limitations Standing;Walking;Sitting;Lifting;House hold activities    How long can you sit comfortably? 5 minutes    How long can you stand comfortably? 5 minutes    How long can you walk comfortably? uses brace to walk    Diagnostic tests xray    Patient Stated Goals decrease pain, be 100%, get back to work    Currently in Pain? Yes    Pain Score 7     Pain Location Knee    Pain Orientation Left    Pain Descriptors / Indicators Aching    Pain Type Acute pain    Pain Onset More than a month ago    Pain Frequency Constant    Aggravating Factors  standing and walking    Pain Relieving Factors medication    Effect of Pain on Daily Activities unable to work                             [facility] Adult PT Treatment/Exercise - 12/27/19 0001      Knee/Hip Exercises: Aerobic   Nustep 3 mins; L1; arms  and legs      Knee/Hip Exercises: Standing   Other Standing Knee Exercises standing knee flexion 2x10 each leg; hip abdcution 2x10; hip extension 2x10;       Knee/Hip Exercises: Seated   Other Seated Knee/Hip Exercises seated hip abdcution 2x10       Knee/Hip Exercises: Supine   Quad Sets Limitations 2x10 5 sec hold     Straight Leg Raises Limitations 3x5 with min a       Manual Therapy   Manual therapy comments attmepted light istraction and joint mobilization to decrease pain but increased pain noted so halted.                   PT Education - 12/27/19 1348    Education Details reviewed activity progression    Person(s) Educated Patient    Methods Explanation;Demonstration;Tactile cues    Comprehension Verbalized understanding;Returned demonstration;Verbal cues required;Tactile cues required            PT Short Term Goals - 12/27/19 1351      PT SHORT TERM GOAL #1   Title Pt will increase knee extension to 0 degrees  AROM.    Baseline can reach full extension but painful    Time 3    Period Weeks    Status Partially Met    Target Date 12/23/19      PT SHORT TERM GOAL #2   Title Pt will increase knee flexion ROM to 100 degrees AROM.    Baseline improving    Time 3    Period Weeks    Status On-going    Target Date 12/23/19      PT SHORT TERM GOAL #3   Title Pt will exhibit decreased TTP in his knee so MMT can be performed to assess LE strength.    Time 3    Period Weeks    Status On-going    Target Date 12/23/19      PT SHORT TERM GOAL #4   Title Pt will demonstrate proper use and wear of knee brace to provide him the most stability.    Time 3    Period Weeks    Status On-going    Target Date 12/23/19             PT Long Term Goals - 12/02/19 1353      PT LONG TERM GOAL #1   Title Pt will be able to ascend and descend 10 stairs with proper mechanics and no complaints of pain.    Time 6    Period Weeks    Status New    Target Date 01/13/20       PT LONG TERM GOAL #2   Title Pt will be able to squat 25 lbs with proper mechanics to be able to go back to work.    Time 6    Period Weeks    Status New    Target Date 01/13/20      PT LONG TERM GOAL #3   Title Pt will be able to perform 30 sec SLS on L leg indicating increased stability.    Time 6    Period Weeks    Status New    Target Date 01/13/20      PT LONG TERM GOAL #4   Title Pt will be able to ambulate 1000' with proper gait mechanics and no reports of pain to be able to return to work.    Time 6    Period Weeks    Status New    Target Date 01/13/20                 Plan - 12/27/19 1121    Clinical Impression Statement Patient had better tolerance to ther-ex today. He continues to report pain with all activity but that is to be expected. He was advised that he is going to feel some pain with some of these activitys until he gets stronger. Therapy attmepted light manual therapy for pain but it caused more pain.    Comorbidities rib fracture, concussion like symptoms    Examination-Activity Limitations Bend;Lift;Stand;Stairs;Squat;Sit;Locomotion Level    Examination-Participation Restrictions Driving;Community Activity    Stability/Clinical Decision Making Evolving/Moderate complexity    Clinical Decision Making Moderate    Rehab Potential Good    PT Frequency 1x / week    PT Duration 6 weeks    PT Treatment/Interventions ADLs/Self Care Home Management;Electrical Stimulation;Ultrasound;Traction;Moist Heat;Iontophoresis 69m/ml Dexamethasone;Gait training;Stair training;Functional mobility training;Therapeutic activities;Therapeutic exercise;Balance training;Neuromuscular re-education;Manual techniques;Patient/family education;Passive range of motion;Dry needling;Joint Manipulations;Spinal Manipulations;Vasopneumatic Device;Taping    PT Next Visit Plan Assess response to HEP and iontophoresis    PT Home  Exercise Plan [id number]: quad sets c towel under knee, LAQ c  ROM for knee flex. Ice pack 10-15 mins every 2 hours as needed.    Consulted and Agree with Plan of Care Patient           Patient will benefit from skilled therapeutic intervention in order to improve the following deficits and impairments:  Abnormal gait, Decreased range of motion, Difficulty walking, Pain, Decreased strength, Decreased mobility, Increased edema  Visit Diagnosis: Acute pain of left knee  Localized edema  Sprain of medial collateral ligament of left knee, initial encounter     Problem List There are no problems to display for this patient.   Carney Living PT DPT  12/27/2019, 1:56 PM  [facility] [address] [place], [place], [address] Phone: [phone]   Fax:  [phone]  Name: Johnpaul Gillentine MRN: [id number] Date of Birth: July 04, 1980

## 2020-01-07 ENCOUNTER — Other Ambulatory Visit: Payer: Self-pay

## 2020-01-07 ENCOUNTER — Ambulatory Visit: Payer: Self-pay | Attending: Orthopaedic Surgery

## 2020-01-07 DIAGNOSIS — S83412A Sprain of medial collateral ligament of left knee, initial encounter: Secondary | ICD-10-CM | POA: Insufficient documentation

## 2020-01-07 DIAGNOSIS — M25562 Pain in left knee: Secondary | ICD-10-CM | POA: Insufficient documentation

## 2020-01-07 DIAGNOSIS — R600 Localized edema: Secondary | ICD-10-CM | POA: Insufficient documentation

## 2020-01-07 NOTE — Therapy (Signed)
[place], [place], [address] Phone: [phone]   Fax:  [phone]  Physical Therapy Treatment  Patient Details  Name: William Escobar MRN: [id number] Date of Birth: 07/28/1980 Referring Provider (PT): Dr. Frankey Shown    Encounter Date: 01/07/2020   PT End of Session - 01/07/20 0922    Visit Number 5    Number of Visits 6    Date for PT Re-Evaluation 01/31/20    PT Start Time 0915    PT Stop Time 1000    PT Time Calculation (min) 45 min    Activity Tolerance Patient tolerated treatment well;Patient limited by pain    Behavior During Therapy [facility] for tasks assessed/performed           History reviewed. No pertinent past medical history.  History reviewed. No pertinent surgical history.  There were no vitals filed for this visit.   Subjective Assessment - 01/07/20 0816    Subjective "My knee is getting better. It hurts when I straighten It and also when my knee has been a position for a while and then I try to move it". Pt reports no to little pain at rest.    Patient Stated Goals decrease pain, be 100%, get back to work    Currently in Pain? Yes    Pain Score 6    with movement   Pain Location Knee    Pain Orientation Right;Posterior;Medial    Pain Descriptors / Indicators Aching    Pain Type Acute pain    Pain Onset More than a month ago    Pain Frequency Constant    Aggravating Factors  Standing and walking    Pain Relieving Factors Medication    Effect of Pain on Daily Activities Unable to work              [facility] PT Assessment - 01/07/20 0001      AROM   Left Knee Extension 0    Left Knee Flexion 124   R=130                        [facility] Adult PT Treatment/Exercise - 01/07/20 0001      Knee/Hip Exercises: Aerobic   Nustep 5 mins; L4: arms and legs      Knee/Hip Exercises: Standing   Heel Raises Both;10 reps    Heel Raises Limitations Toe raises    Knee  Flexion Strengthening;Left;Right;10 reps    Hip Flexion Stengthening;Right;Left;10 reps    Terminal Knee Extension Strengthening;Left;10 reps    Terminal Knee Extension Limitations ball    Hip Abduction Stengthening;Right;Left;10 reps    Hip Extension Stengthening;Right;Left;10 reps    SLS L and R; 10x      Knee/Hip Exercises: Supine   Quad Sets Strengthening;Left;2 sets;10 reps    Straight Leg Raises Limitations 10 reps c min A                  PT Education - 01/07/20 6962    Education Details HEP    Person(s) Educated Patient    Methods Explanation;Demonstration;Tactile cues;Verbal cues;Handout    Comprehension Verbalized understanding;Returned demonstration;Verbal cues required;Tactile cues required;Need further instruction            PT Short Term Goals - 12/27/19 1351      PT SHORT TERM GOAL #1   Title Pt will increase knee extension to 0 degrees AROM.    Baseline can reach full extension  but painful    Time 3    Period Weeks    Status Partially Met    Target Date 12/23/19      PT SHORT TERM GOAL #2   Title Pt will increase knee flexion ROM to 100 degrees AROM.    Baseline improving    Time 3    Period Weeks    Status On-going    Target Date 12/23/19      PT SHORT TERM GOAL #3   Title Pt will exhibit decreased TTP in his knee so MMT can be performed to assess LE strength.    Time 3    Period Weeks    Status On-going    Target Date 12/23/19      PT SHORT TERM GOAL #4   Title Pt will demonstrate proper use and wear of knee brace to provide him the most stability.    Time 3    Period Weeks    Status On-going    Target Date 12/23/19             PT Long Term Goals - 12/02/19 1353      PT LONG TERM GOAL #1   Title Pt will be able to ascend and descend 10 stairs with proper mechanics and no complaints of pain.    Time 6    Period Weeks    Status New    Target Date 01/13/20      PT LONG TERM GOAL #2   Title Pt will be able to squat 25 lbs  with proper mechanics to be able to go back to work.    Time 6    Period Weeks    Status New    Target Date 01/13/20      PT LONG TERM GOAL #3   Title Pt will be able to perform 30 sec SLS on L leg indicating increased stability.    Time 6    Period Weeks    Status New    Target Date 01/13/20      PT LONG TERM GOAL #4   Title Pt will be able to ambulate 1000' with proper gait mechanics and no reports of pain to be able to return to work.    Time 6    Period Weeks    Status New    Target Date 01/13/20                 Plan - 01/07/20 0909    Clinical Impression Statement L knee ROM has improved with Ext at 0d and flexion minmal less than R knee flexion. Function has improved as well with pt walking c a min antalgic gait pattern. Pt tolerated a progression of his ther ex today. Pt continues to have L knee pain which is to be expected, but is progressing appropriately.as noted.    Personal Factors and Comorbidities Comorbidity 2    Comorbidities rib fracture, concussion like symptoms    Examination-Activity Limitations Bend;Lift;Stand;Stairs;Squat;Sit;Locomotion Level    Examination-Participation Restrictions Driving;Community Activity    Stability/Clinical Decision Making Evolving/Moderate complexity    Clinical Decision Making Moderate    Rehab Potential Good    PT Frequency 1x / week    PT Duration 6 weeks    PT Treatment/Interventions ADLs/Self Care Home Management;Electrical Stimulation;Ultrasound;Traction;Moist Heat;Iontophoresis 4mg /ml Dexamethasone;Gait training;Stair training;Functional mobility training;Therapeutic activities;Therapeutic exercise;Balance training;Neuromuscular re-education;Manual techniques;Patient/family education;Passive range of motion;Dry needling;Joint Manipulations;Spinal Manipulations;Vasopneumatic Device;Taping    PT Next Visit Plan Complete re-assessment.    PT Home  Exercise Plan [id number]: quad sets c towel under knee, LAQ c ROM for knee flex.  Ice pack 10-15 mins every 2 hours as needed. [id number]: Standing TKE c ball on wall.    Consulted and Agree with Plan of Care Patient           Patient will benefit from skilled therapeutic intervention in order to improve the following deficits and impairments:  Abnormal gait, Decreased range of motion, Difficulty walking, Pain, Decreased strength, Decreased mobility, Increased edema  Visit Diagnosis: Acute pain of left knee  Localized edema  Sprain of medial collateral ligament of left knee, initial encounter     Problem List There are no problems to display for this patient.  Gar Ponto MS, PT 01/07/20 8:48 PM  [facility] [address] [place], [place], [address] Phone: [phone]   Fax:  [phone]  Name: William Escobar MRN: [id number] Date of Birth: 29-Nov-1980

## 2020-01-14 ENCOUNTER — Ambulatory Visit: Payer: Self-pay

## 2020-01-14 ENCOUNTER — Other Ambulatory Visit: Payer: Self-pay

## 2020-01-14 DIAGNOSIS — R600 Localized edema: Secondary | ICD-10-CM

## 2020-01-14 DIAGNOSIS — M25562 Pain in left knee: Secondary | ICD-10-CM

## 2020-01-14 DIAGNOSIS — S83412A Sprain of medial collateral ligament of left knee, initial encounter: Secondary | ICD-10-CM

## 2020-01-16 NOTE — Therapy (Signed)
[facility] Outpatient Rehabilitation [facility] [address] [place], [place], [address] Phone: [phone]   Fax:  [phone]  Physical Therapy Treatment/Recert  Patient Details  Name: William Escobar MRN: [id number] Date of Birth: Jan 27, 1981 Referring Provider (PT): Dr. Gershon Mussel    Encounter Date: 01/14/2020   PT End of Session - 01/16/20 1828    Visit Number 6    Number of Visits 6    Date for PT Re-Evaluation 02/18/20    PT Start Time 0750    PT Stop Time 0834    PT Time Calculation (min) 44 min    Activity Tolerance Patient tolerated treatment well;Patient limited by pain    Behavior During Therapy [facility] for tasks assessed/performed           History reviewed. No pertinent past medical history.  History reviewed. No pertinent surgical history.  There were no vitals filed for this visit.   Subjective Assessment - 01/16/20 1824    Subjective Pt reports he is having sharp pain in his L knee esp. with straightening and walking. Overall the pain is better    Currently in Pain? Yes    Pain Score 6     Pain Location Knee    Pain Orientation Right;Posterior;Medial    Pain Descriptors / Indicators Aching;Sharp    Pain Type Acute pain    Pain Frequency Constant    Aggravating Factors  Standing, walking, knee extension    Pain Relieving Factors Medication    Effect of Pain on Daily Activities Unable to work              [facility] PT Assessment - 01/16/20 0001      Palpation   Palpation comment TTP medial and posterior apsect of       Ambulation/Gait   Ambulation/Gait Yes    Ambulation/Gait Assistance 7: Independent    Gait Pattern Step-through pattern;Antalgic   antalgic gait is minimal   Stairs Yes    Stairs Assistance 6: Modified independent (Device/Increase time)   Painful to asc c LE and to dsc c L LE as stance                          [facility] Adult PT Treatment/Exercise - 01/16/20 0001      Exercises   Exercises  Knee/Hip      Knee/Hip Exercises: Aerobic   Nustep 5 mins; L4: arms and legs      Knee/Hip Exercises: Standing   Heel Raises Both;10 reps    Heel Raises Limitations Toe raises    Knee Flexion Strengthening;Left;Right;10 reps    Hip Flexion Stengthening;Right;Left;10 reps    Terminal Knee Extension Strengthening;Left;10 reps    Terminal Knee Extension Limitations ball behind knee    Hip Abduction Stengthening;Right;Left;10 reps    Hip Extension Stengthening;Right;Left;10 reps    SLS L and R; 10x                    PT Short Term Goals - 01/16/20 1854      PT SHORT TERM GOAL #1   Title Pt will increase knee extension to 0 degrees AROM. Achieved    Status Achieved    Target Date 01/07/20      PT SHORT TERM GOAL #2   Title Pt will increase knee flexion ROM to 100 degrees AROM. Achieved: 124d    Status Achieved    Target Date 01/07/20      PT SHORT TERM  GOAL #3   Title Pt will exhibit decreased TTP in his knee so MMT can be performed to assess LE strength. To be assessed    Status On-going      PT SHORT TERM GOAL #4   Title Pt will demonstrate proper use and wear of knee brace to provide him the most stability.On-going- Pt has not brought a knee brace torecent PT sessions.    Status On-going             PT Long Term Goals - 01/16/20 1856      PT LONG TERM GOAL #1   Title Pt will be able to ascend and descend 10 stairs with proper mechanics and no complaints of pain. On-going: Painful and needs handrail A    Status On-going      PT LONG TERM GOAL #2   Title Pt will be able to squat 25 lbs with proper mechanics to be able to go back to work. Not tested    Status On-going      PT LONG TERM GOAL #3   Title Pt will be able to perform 30 sec SLS on L leg indicating increased stability. Pt has not demonstrated the ability with ther ex    Status On-going      PT LONG TERM GOAL #4   Title Pt will be able to ambulate 1000' with proper gait mechanics and no reports  of pain to be able to return to work. On-going: Gait pattern is improve, but still painful.    Status On-going                 Plan - 01/16/20 1837    Clinical Impression Statement Pt's overall pain and function of the L LE is improved. With walking, the pt demonstrates a less antalgic gait pattern over the L LE. Pain continues limit function, extending the L knee and wt. bearing on a straight LE is painful. Asc. steps c the L LE and using the L LE for the stance leg to dsc is painful to the degree the pt needs a handrail assist c 1 hand to complete. Pt does not demonstrate the ability to asc/dsc a ladder which is part of his job. Recommend the continuation of PT 1w6 to increase strength and decrease pain to maximize the pt's functional use of the L LE. If pt does not continue to make appropriate progress, he will be referred back to Dr. Roda Shutters.    Personal Factors and Comorbidities Comorbidity 2    Comorbidities rib fracture, concussion like symptoms    Examination-Activity Limitations Bend;Lift;Stand;Stairs;Squat;Sit;Locomotion Level    Examination-Participation Restrictions Driving;Community Activity    Stability/Clinical Decision Making Evolving/Moderate complexity    Clinical Decision Making Moderate    Rehab Potential Good    PT Frequency 1x / week    PT Duration 6 weeks    PT Treatment/Interventions ADLs/Self Care Home Management;Electrical Stimulation;Ultrasound;Traction;Moist Heat;Iontophoresis 4mg /ml Dexamethasone;Gait training;Stair training;Functional mobility training;Therapeutic activities;Therapeutic exercise;Balance training;Neuromuscular re-education;Manual techniques;Patient/family education;Passive range of motion;Dry needling;Joint Manipulations;Spinal Manipulations;Vasopneumatic Device;Taping    PT Next Visit Plan Progress strengthening and functional mobility as tolerated    PT Home Exercise Plan [id number]: quad sets c towel under knee, LAQ c ROM for knee flex. Ice pack  10-15 mins every 2 hours as needed. : Standing TKE c ball on wall.    Consulted and Agree with Plan of Care Patient           Patient will benefit from skilled therapeutic intervention in  order to improve the following deficits and impairments:  Abnormal gait, Decreased range of motion, Difficulty walking, Pain, Decreased strength, Decreased mobility, Increased edema  Visit Diagnosis: Acute pain of left knee - Plan: PT plan of care cert/re-cert  Localized edema - Plan: PT plan of care cert/re-cert  Sprain of medial collateral ligament of left knee, initial encounter - Plan: PT plan of care cert/re-cert     Problem List There are no problems to display for this patient.   Joellyn Rued MS, PT 01/16/20 7:10 PM  [facility] Outpatient Rehabilitation [facility] [address] [place], [place], [address] Phone: [phone]   Fax:  [phone]  Name: William Escobar MRN: [id number] Date of Birth: 10-Jun-1980

## 2020-01-21 ENCOUNTER — Other Ambulatory Visit: Payer: Self-pay

## 2020-01-21 ENCOUNTER — Ambulatory Visit: Payer: Self-pay | Admitting: Internal Medicine

## 2020-01-21 ENCOUNTER — Ambulatory Visit: Payer: Self-pay | Admitting: Physical Therapy

## 2020-01-21 ENCOUNTER — Encounter: Payer: Self-pay | Admitting: Physical Therapy

## 2020-01-21 DIAGNOSIS — R600 Localized edema: Secondary | ICD-10-CM

## 2020-01-21 DIAGNOSIS — M25562 Pain in left knee: Secondary | ICD-10-CM

## 2020-01-21 DIAGNOSIS — S83412A Sprain of medial collateral ligament of left knee, initial encounter: Secondary | ICD-10-CM

## 2020-01-21 NOTE — Therapy (Signed)
[facility] Outpatient Rehabilitation [facility] [address] [place], [place], [address] Phone: [phone]   Fax:  [phone]  Physical Therapy Treatment  Patient Details  Name: William Escobar MRN: [id number] Date of Birth: Jun 04, 1980 Referring Provider (PT): Dr. Gershon Mussel    Encounter Date: 01/21/2020   PT End of Session - 01/21/20 0754    Visit Number 7    Number of Visits 12    Date for PT Re-Evaluation 03/03/20    PT Start Time 0750    PT Stop Time 0830    PT Time Calculation (min) 40 min    Activity Tolerance Patient tolerated treatment well    Behavior During Therapy [facility] for tasks assessed/performed           History reviewed. No pertinent past medical history.  History reviewed. No pertinent surgical history.  There were no vitals filed for this visit.   Subjective Assessment - 01/21/20 0752    Subjective Patient reports his knee is doing better but he is still having pain everyday. His pain this morning is about a 6/10.    Pertinent History probably concussion and rib fx    Limitations Standing;Walking;Sitting;Lifting;House hold activities    How long can you sit comfortably? 5 minutes    How long can you stand comfortably? 5 minutes    How long can you walk comfortably? uses brace to walk    Diagnostic tests xray    Patient Stated Goals decrease pain, be 100%, get back to work    Currently in Pain? Yes    Pain Score 6     Pain Location Knee    Pain Orientation Right    Pain Descriptors / Indicators Aching    Pain Onset More than a month ago    Aggravating Factors  straightening his knee    Pain Relieving Factors medication    Effect of Pain on Daily Activities unable to work              [facility] PT Assessment - 01/21/20 0001      Assessment   Medical Diagnosis L medial meniscus sprain    Referring Provider (PT) Dr. Gershon Mussel       AROM   Left Knee Extension 0    Left Knee Flexion 125      Strength   Right/Left Hip  Right    Right Hip Flexion 3+/5   still havingdifficulty doing his straight leg rasies                         [facility] Adult PT Treatment/Exercise - 01/21/20 0001      Knee/Hip Exercises: Aerobic   Nustep 5 mins; L4: arms and legs      Knee/Hip Exercises: Standing   Heel Raises Both;10 reps    Knee Flexion Strengthening;Left;Right;10 reps    Terminal Knee Extension Strengthening;Left;10 reps    Forward Step Up 1 set;10 reps;Step Height: 4"    SLS L and R; 10x      Knee/Hip Exercises: Supine   Quad Sets Strengthening;Left;2 sets;10 reps    Quad Sets Limitations 2x10     Straight Leg Raises Limitations 10 reps c min A           bridges 2x5 reported pain in his ribs         PT Education - 01/21/20 0754    Education Details reviewed exercises    Person(s) Educated Patient    Methods Demonstration;Explanation;Tactile  cues;Verbal cues    Comprehension Verbalized understanding;Returned demonstration;Verbal cues required;Tactile cues required            PT Short Term Goals - 01/16/20 1854      PT SHORT TERM GOAL #1   Title Pt will increase knee extension to 0 degrees AROM. Achieved    Status Achieved    Target Date 01/07/20      PT SHORT TERM GOAL #2   Title Pt will increase knee flexion ROM to 100 degrees AROM. Achieved: 124d    Status Achieved    Target Date 01/07/20      PT SHORT TERM GOAL #3   Title Pt will exhibit decreased TTP in his knee so MMT can be performed to assess LE strength. To be assessed    Status On-going      PT SHORT TERM GOAL #4   Title Pt will demonstrate proper use and wear of knee brace to provide him the most stability.On-going- Pt has not brought a knee brace torecent PT sessions.    Status On-going             PT Long Term Goals - 01/16/20 1856      PT LONG TERM GOAL #1   Title Pt will be able to ascend and descend 10 stairs with proper mechanics and no complaints of pain. On-going: Painful and needs handrail A     Status On-going      PT LONG TERM GOAL #2   Title Pt will be able to squat 25 lbs with proper mechanics to be able to go back to work. Not tested    Status On-going      PT LONG TERM GOAL #3   Title Pt will be able to perform 30 sec SLS on L leg indicating increased stability. Pt has not demonstrated the ability with ther ex    Status On-going      PT LONG TERM GOAL #4   Title Pt will be able to ambulate 1000' with proper gait mechanics and no reports of pain to be able to return to work. On-going: Gait pattern is improve, but still painful.    Status On-going                 Plan - 01/21/20 1540    Clinical Impression Statement The patients knee motion is improving but his strength is still very limited. He is limited by pain with base exercises. He is still having significant difficulty with SLR. He was advised that he needs to be working on these exercises everyday. At this point he is likley not going to be able to climb a ladder yet. He is also concerned abouth the rib fractures giving him difficulty swining a hammer. He would benefit from further skilled therapy for strength training to improve ability to return to work.    Personal Factors and Comorbidities Comorbidity 2    Comorbidities rib fracture, concussion like symptoms    Examination-Activity Limitations Bend;Lift;Stand;Stairs;Squat;Sit;Locomotion Level    Examination-Participation Restrictions Driving;Community Activity    Rehab Potential Good    PT Frequency 1x / week    PT Duration 6 weeks    PT Treatment/Interventions ADLs/Self Care Home Management;Electrical Stimulation;Ultrasound;Traction;Moist Heat;Iontophoresis 4mg /ml Dexamethasone;Gait training;Stair training;Functional mobility training;Therapeutic activities;Therapeutic exercise;Balance training;Neuromuscular re-education;Manual techniques;Patient/family education;Passive range of motion;Dry needling;Joint Manipulations;Spinal Manipulations;Vasopneumatic  Device;Taping    PT Next Visit Plan Progress strengthening and functional mobility as tolerated    PT Home Exercise Plan [id number]: quad sets c towel  under knee, LAQ c ROM for knee flex. Ice pack 10-15 mins every 2 hours as needed. [id number]: Standing TKE c ball on wall.    Consulted and Agree with Plan of Care Patient           Patient will benefit from skilled therapeutic intervention in order to improve the following deficits and impairments:  Abnormal gait, Decreased range of motion, Difficulty walking, Pain, Decreased strength, Decreased mobility, Increased edema  Visit Diagnosis: Acute pain of left knee  Localized edema  Sprain of medial collateral ligament of left knee, initial encounter     Problem List There are no problems to display for this patient.   Dessie Coma 01/21/2020, 12:59 PM  [facility] [address] [place], [place], [address] Phone: [phone]   Fax:  [phone]  Name: William Escobar MRN: [id number] Date of Birth: [birth year]

## 2020-01-28 ENCOUNTER — Ambulatory Visit: Payer: Self-pay | Attending: Orthopaedic Surgery | Admitting: Physical Therapy

## 2020-01-28 ENCOUNTER — Other Ambulatory Visit: Payer: Self-pay

## 2020-01-28 ENCOUNTER — Encounter: Payer: Self-pay | Admitting: Physical Therapy

## 2020-01-28 DIAGNOSIS — M25562 Pain in left knee: Secondary | ICD-10-CM | POA: Insufficient documentation

## 2020-01-28 DIAGNOSIS — S83412A Sprain of medial collateral ligament of left knee, initial encounter: Secondary | ICD-10-CM | POA: Insufficient documentation

## 2020-01-28 DIAGNOSIS — R600 Localized edema: Secondary | ICD-10-CM | POA: Insufficient documentation

## 2020-01-28 NOTE — Therapy (Signed)
[facility] Outpatient Rehabilitation [facility] [address] [place], [place], [address] Phone: [phone]   Fax:  [phone]  Physical Therapy Treatment  Patient Details  Name: William Escobar MRN: [id number] Date of Birth: 06-22-1980 Referring Provider (PT): Dr. Gershon Mussel    Encounter Date: 01/28/2020   PT End of Session - 01/28/20 0828    Visit Number 8    Number of Visits 12    Date for PT Re-Evaluation 03/03/20    PT Start Time 0800    PT Stop Time 0842    PT Time Calculation (min) 42 min    Activity Tolerance Patient tolerated treatment well    Behavior During Therapy [facility] for tasks assessed/performed           History reviewed. No pertinent past medical history.  History reviewed. No pertinent surgical history.  There were no vitals filed for this visit.   Subjective Assessment - 01/28/20 0806    Subjective Patient reports that the knee is doing a little better today.The patient has an appointment october 10th to have his ribs checked out.    Patient is accompained by: Interpreter   [place] [employer] [phone] [employer]   Pertinent History probably concussion and rib fx    Limitations Standing;Walking;Sitting;Lifting;House hold activities    How long can you sit comfortably? 5 minutes    How long can you walk comfortably? uses brace to walk    Patient Stated Goals decrease pain, be 100%, get back to work    Currently in Pain? Yes    Pain Score 5     Pain Location Knee    Pain Descriptors / Indicators Aching    Pain Type Acute pain    Pain Onset More than a month ago    Pain Frequency Constant    Aggravating Factors  straightneing his knee and walking    Pain Relieving Factors medication    Effect of Pain on Daily Activities unable to work                             [facility] Adult PT Treatment/Exercise - 01/28/20 0001      Knee/Hip Exercises: Aerobic   Nustep 5 mins; L4: arms and legs      Knee/Hip Exercises: Standing   Heel  Raises Both;10 reps    Knee Flexion Left;10 reps    Hip Flexion Stengthening;Right;Left;10 reps    Terminal Knee Extension Strengthening;Left;10 reps    Theraband Level (Terminal Knee Extension) Level 3 (Green)    Hip Abduction Stengthening;Right;Left;10 reps    Forward Step Up 10 reps;2 sets;Step Height: 6"    SLS L and R; 5x , about 5 sec each     Other Standing Knee Exercises sit-stand x 10     Other Standing Knee Exercises mini squats at freemotion bar x 10 ; squats with 15lb weight significant cuing for technique. Will need more cuing.       Knee/Hip Exercises: Seated   Other Seated Knee/Hip Exercises seated hip abdcution x20       Knee/Hip Exercises: Supine   Bridges 10 reps    Straight Leg Raises Left;10 reps    Straight Leg Raises Limitations no assist                   PT Education - 01/28/20 0828    Education Details reviewed HEp and symptom mangement    Person(s) Educated Patient    Methods Explanation;Demonstration;Tactile  cues;Verbal cues    Comprehension Verbalized understanding;Returned demonstration;Verbal cues required;Tactile cues required            PT Short Term Goals - 01/16/20 1854      PT SHORT TERM GOAL #1   Title Pt will increase knee extension to 0 degrees AROM. Achieved    Status Achieved    Target Date 01/07/20      PT SHORT TERM GOAL #2   Title Pt will increase knee flexion ROM to 100 degrees AROM. Achieved: 124d    Status Achieved    Target Date 01/07/20      PT SHORT TERM GOAL #3   Title Pt will exhibit decreased TTP in his knee so MMT can be performed to assess LE strength. To be assessed    Status On-going      PT SHORT TERM GOAL #4   Title Pt will demonstrate proper use and wear of knee brace to provide him the most stability.On-going- Pt has not brought a knee brace torecent PT sessions.    Status On-going             PT Long Term Goals - 01/16/20 1856      PT LONG TERM GOAL #1   Title Pt will be able to ascend and  descend 10 stairs with proper mechanics and no complaints of pain. On-going: Painful and needs handrail A    Status On-going      PT LONG TERM GOAL #2   Title Pt will be able to squat 25 lbs with proper mechanics to be able to go back to work. Not tested    Status On-going      PT LONG TERM GOAL #3   Title Pt will be able to perform 30 sec SLS on L leg indicating increased stability. Pt has not demonstrated the ability with ther ex    Status On-going      PT LONG TERM GOAL #4   Title Pt will be able to ambulate 1000' with proper gait mechanics and no reports of pain to be able to return to work. On-going: Gait pattern is improve, but still painful.    Status On-going                 Plan - 01/28/20 2683    Clinical Impression Statement Patient continues to make progress. He made good progress with straight leg leg compared to last week. He also had an improved tolerance to bridging with his ribs. He contineus to have some pain with standing exercises. His pain is more medial today. Therapy advanced the patients step height. We attmepted to perfrom a leg press but before patient ven got on the machine he reported he couldn't do it.    Personal Factors and Comorbidities Comorbidity 2    Comorbidities rib fracture, concussion like symptoms    Examination-Activity Limitations Bend;Lift;Stand;Stairs;Squat;Sit;Locomotion Level    Examination-Participation Restrictions Driving;Community Activity    Stability/Clinical Decision Making Evolving/Moderate complexity    Clinical Decision Making Moderate    Rehab Potential Good    PT Frequency 1x / week    PT Duration 6 weeks    PT Treatment/Interventions ADLs/Self Care Home Management;Electrical Stimulation;Ultrasound;Traction;Moist Heat;Iontophoresis 4mg /ml Dexamethasone;Gait training;Stair training;Functional mobility training;Therapeutic activities;Therapeutic exercise;Balance training;Neuromuscular re-education;Manual  techniques;Patient/family education;Passive range of motion;Dry needling;Joint Manipulations;Spinal Manipulations;Vasopneumatic Device;Taping    PT Next Visit Plan Progress strengthening and functional mobility as tolerated    PT Home Exercise Plan [id number]: quad sets c towel under knee, LAQ c  ROM for knee flex. Ice pack 10-15 mins every 2 hours as needed. [id number]: Standing TKE c ball on wall.    Consulted and Agree with Plan of Care Patient           Patient will benefit from skilled therapeutic intervention in order to improve the following deficits and impairments:  Abnormal gait, Decreased range of motion, Difficulty walking, Pain, Decreased strength, Decreased mobility, Increased edema  Visit Diagnosis: Acute pain of left knee  Localized edema  Sprain of medial collateral ligament of left knee, initial encounter     Problem List There are no problems to display for this patient.   Dessie Coma PT DPT  01/28/2020, 9:07 AM  [facility] [address] [place], [place], [address] Phone: [phone]   Fax:  [phone]  Name: Ousmane Seeman MRN: [id number] Date of Birth: 12/09/1980

## 2020-02-04 ENCOUNTER — Ambulatory Visit: Payer: Self-pay | Admitting: Physical Therapy

## 2020-02-04 ENCOUNTER — Other Ambulatory Visit: Payer: Self-pay

## 2020-02-04 ENCOUNTER — Encounter: Payer: Self-pay | Admitting: Physical Therapy

## 2020-02-04 DIAGNOSIS — M25562 Pain in left knee: Secondary | ICD-10-CM

## 2020-02-04 DIAGNOSIS — R600 Localized edema: Secondary | ICD-10-CM

## 2020-02-04 DIAGNOSIS — S83412A Sprain of medial collateral ligament of left knee, initial encounter: Secondary | ICD-10-CM

## 2020-02-04 NOTE — Therapy (Signed)
[facility] Outpatient Rehabilitation [facility] [address] [place], [place], [address] Phone: [phone]   Fax:  [phone]  Physical Therapy Treatment  Patient Details  Name: William Escobar MRN: [id number] Date of Birth: [birth year] Referring Provider (PT): Dr. Gershon Mussel    Encounter Date: 02/04/2020   PT End of Session - 02/04/20 0806    Visit Number 9    Number of Visits 12    Date for PT Re-Evaluation 03/03/20    PT Start Time 0800    PT Stop Time 0840    PT Time Calculation (min) 40 min    Activity Tolerance Patient tolerated treatment well    Behavior During Therapy [facility] for tasks assessed/performed           History reviewed. No pertinent past medical history.  History reviewed. No pertinent surgical history.  There were no vitals filed for this visit.   Subjective Assessment - 02/04/20 0805    Subjective Patient reports the knee has been better. He is still having pain in the ribs. He has been working on his exercises.    Pertinent History probably concussion and rib fx    Limitations Standing;Walking;Sitting;Lifting;House hold activities    How long can you sit comfortably? 5 minutes    How long can you stand comfortably? 5 minutes    How long can you walk comfortably? uses brace to walk    Diagnostic tests xray    Patient Stated Goals decrease pain, be 100%, get back to work    Currently in Pain? Yes    Pain Score 5     Pain Location Knee    Pain Orientation Right    Pain Descriptors / Indicators Aching    Pain Type Chronic pain    Pain Onset More than a month ago    Pain Frequency Constant    Aggravating Factors  straightening the knee and walking    Pain Relieving Factors medication    Effect of Pain on Daily Activities unable to work                             [facility] Adult PT Treatment/Exercise - 02/04/20 0001      Knee/Hip Exercises: Aerobic   Nustep 5 mins; L4: arms and legs      Knee/Hip  Exercises: Standing   Heel Raises 20 reps    Knee Flexion Left;10 reps    Hip Flexion Stengthening;Right;Left;10 reps    Hip Abduction Right;Left;20 reps    Hip Extension 20 reps;Right;Left    Lateral Step Up 15 reps;Step Height: 4";Hand Hold: 2    Forward Step Up 10 reps;2 sets;Step Height: 6"    SLS L and R; 5x , about 5 sec each     Other Standing Knee Exercises step onto air-ex x15 forward and back/ side to side       Knee/Hip Exercises: Supine   Quad Sets Limitations 2x10 5 sec hold     Heel Slides Limitations 2x15 3lbs     Straight Leg Raises 10 reps;3 sets                  PT Education - 02/04/20 0806    Education Details reviewed HEP and symptom mangement    Person(s) Educated Patient    Methods Explanation;Demonstration;Tactile cues;Verbal cues    Comprehension Returned demonstration;Verbalized understanding;Verbal cues required;Tactile cues required            PT  Short Term Goals - 01/16/20 1854      PT SHORT TERM GOAL #1   Title Pt will increase knee extension to 0 degrees AROM. Achieved    Status Achieved    Target Date 01/07/20      PT SHORT TERM GOAL #2   Title Pt will increase knee flexion ROM to 100 degrees AROM. Achieved: 124d    Status Achieved    Target Date 01/07/20      PT SHORT TERM GOAL #3   Title Pt will exhibit decreased TTP in his knee so MMT can be performed to assess LE strength. To be assessed    Status On-going      PT SHORT TERM GOAL #4   Title Pt will demonstrate proper use and wear of knee brace to provide him the most stability.On-going- Pt has not brought a knee brace torecent PT sessions.    Status On-going             PT Long Term Goals - 01/16/20 1856      PT LONG TERM GOAL #1   Title Pt will be able to ascend and descend 10 stairs with proper mechanics and no complaints of pain. On-going: Painful and needs handrail A    Status On-going      PT LONG TERM GOAL #2   Title Pt will be able to squat 25 lbs with  proper mechanics to be able to go back to work. Not tested    Status On-going      PT LONG TERM GOAL #3   Title Pt will be able to perform 30 sec SLS on L leg indicating increased stability. Pt has not demonstrated the ability with ther ex    Status On-going      PT LONG TERM GOAL #4   Title Pt will be able to ambulate 1000' with proper gait mechanics and no reports of pain to be able to return to work. On-going: Gait pattern is improve, but still painful.    Status On-going                 Plan - 02/04/20 0816    Clinical Impression Statement Patient continues to tolerate exercises better but still has pain deep in his knee. He is alo reporting increased pain in his liver and swelling on the right side. He is scheduled to see a primary care MD on Monday. He reports he did not go the last time because of finances. She was strongly advised that if he feels like he is having liver problems he should get them checked out ASAP. Therapy will continue to advance exercises as tolerated.    Comorbidities rib fracture, concussion like symptoms    Examination-Activity Limitations Bend;Lift;Stand;Stairs;Squat;Sit;Locomotion Level    Examination-Participation Restrictions Driving;Community Activity    Stability/Clinical Decision Making Evolving/Moderate complexity    Clinical Decision Making Moderate    Rehab Potential Good    PT Frequency 1x / week    PT Duration 6 weeks    PT Treatment/Interventions ADLs/Self Care Home Management;Electrical Stimulation;Ultrasound;Traction;Moist Heat;Iontophoresis 4mg /ml Dexamethasone;Gait training;Stair training;Functional mobility training;Therapeutic activities;Therapeutic exercise;Balance training;Neuromuscular re-education;Manual techniques;Patient/family education;Passive range of motion;Dry needling;Joint Manipulations;Spinal Manipulations;Vasopneumatic Device;Taping    PT Next Visit Plan Progress strengthening and functional mobility as tolerated    PT  Home Exercise Plan [id number]: quad sets c towel under knee, LAQ c ROM for knee flex. Ice pack 10-15 mins every 2 hours as needed. : Standing TKE c ball on wall.  Consulted and Agree with Plan of Care Patient           Patient will benefit from skilled therapeutic intervention in order to improve the following deficits and impairments:  Abnormal gait, Decreased range of motion, Difficulty walking, Pain, Decreased strength, Decreased mobility, Increased edema  Visit Diagnosis: Acute pain of left knee  Localized edema  Sprain of medial collateral ligament of left knee, initial encounter     Problem List There are no problems to display for this patient.   Dessie Coma PT DPT  02/04/2020, 12:13 PM  [facility] [address] [place], [place], [address] Phone: [phone]   Fax:  [phone]  Name: William Escobar MRN: [id number] Date of Birth: 01/18/1981

## 2020-02-11 ENCOUNTER — Encounter: Payer: Self-pay | Admitting: Physical Therapy

## 2020-02-11 ENCOUNTER — Other Ambulatory Visit: Payer: Self-pay

## 2020-02-11 ENCOUNTER — Ambulatory Visit: Payer: Self-pay | Admitting: Physical Therapy

## 2020-02-11 DIAGNOSIS — S83412A Sprain of medial collateral ligament of left knee, initial encounter: Secondary | ICD-10-CM

## 2020-02-11 DIAGNOSIS — R600 Localized edema: Secondary | ICD-10-CM

## 2020-02-11 DIAGNOSIS — M25562 Pain in left knee: Secondary | ICD-10-CM

## 2020-02-11 NOTE — Therapy (Signed)
[facility] Outpatient Rehabilitation [facility] [address] [place], [place], [address] Phone: [phone]   Fax:  [phone]  Physical Therapy Treatment  Patient Details  Name: Liston Thum MRN: [id number] Date of Birth: 1980-09-22 Referring Provider (PT): Dr. Gershon Mussel    Encounter Date: 02/11/2020    PT End of Session - 02/11/20 0807    Visit Number 10    Number of Visits 12    Date for PT Re-Evaluation 03/03/20    PT Start Time 0803    PT Stop Time 0841    PT Time Calculation (min) 38 min    Activity Tolerance Patient tolerated treatment well    Behavior During Therapy [facility] for tasks assessed/performed           History reviewed. No pertinent past medical history.  History reviewed. No pertinent surgical history.  There were no vitals filed for this visit.   Subjective Assessment - 02/11/20 0806    Subjective Patient reports he has been getting much better. He is having less pain in his knee and his rib area. he is no longer having swelling in is right leg.    Patient is accompained by: Interpreter    Pertinent History probably concussion and rib fx    Limitations Standing;Walking;Sitting;Lifting;House hold activities    How long can you sit comfortably? 5 minutes    How long can you stand comfortably? 5 minutes    How long can you walk comfortably? uses brace to walk    Diagnostic tests xray    Patient Stated Goals decrease pain, be 100%, get back to work    Currently in Pain? No/denies                             [facility] Adult PT Treatment/Exercise - 02/11/20 0001      Knee/Hip Exercises: Aerobic   Nustep 5 mins; L4: arms and legs      Knee/Hip Exercises: Standing   Heel Raises 20 reps    Lateral Step Up 15 reps;Hand Hold: 2;Step Height: 6"    Forward Step Up 10 reps;2 sets;Step Height: 8"    Other Standing Knee Exercises sit-stand x 10     Other Standing Knee Exercises mini squats x20; kettle bell lift 2x10  15 lbs; kettle bell hold 75'x2 5lba dn 10lb       Knee/Hip Exercises: Supine   Short Arc Quad Sets Limitations 2x15 3lbs     Straight Leg Raises 10 reps;3 sets    Straight Leg Raises Limitations 2lb                   PT Education - 02/11/20 0806    Education Details HEP and symptom mangagement    Person(s) Educated Patient    Methods Demonstration;Tactile cues;Explanation;Verbal cues    Comprehension Verbalized understanding;Returned demonstration;Verbal cues required;Tactile cues required            PT Short Term Goals - 01/16/20 1854      PT SHORT TERM GOAL #1   Title Pt will increase knee extension to 0 degrees AROM. Achieved    Status Achieved    Target Date 01/07/20      PT SHORT TERM GOAL #2   Title Pt will increase knee flexion ROM to 100 degrees AROM. Achieved: 124d    Status Achieved    Target Date 01/07/20      PT SHORT TERM GOAL #3   Title  Pt will exhibit decreased TTP in his knee so MMT can be performed to assess LE strength. To be assessed    Status On-going      PT SHORT TERM GOAL #4   Title Pt will demonstrate proper use and wear of knee brace to provide him the most stability.On-going- Pt has not brought a knee brace torecent PT sessions.    Status On-going             PT Long Term Goals - 01/16/20 1856      PT LONG TERM GOAL #1   Title Pt will be able to ascend and descend 10 stairs with proper mechanics and no complaints of pain. On-going: Painful and needs handrail A    Status On-going      PT LONG TERM GOAL #2   Title Pt will be able to squat 25 lbs with proper mechanics to be able to go back to work. Not tested    Status On-going      PT LONG TERM GOAL #3   Title Pt will be able to perform 30 sec SLS on L leg indicating increased stability. Pt has not demonstrated the ability with ther ex    Status On-going      PT LONG TERM GOAL #4   Title Pt will be able to ambulate 1000' with proper gait mechanics and no reports of pain to be  able to return to work. On-going: Gait pattern is improve, but still painful.    Status On-going                 Plan - 02/11/20 0818    Clinical Impression Statement Therapy initiated lifting exercises to continue progression towards work activity. He had no signifcant increase in pain. He is having less pain in his ribs and knees. At this time he needs to be able to clinb a ladder to go back to work. He feels like he may change jobs. He has one more visit then he sees Dr Jenkins Rouge. He does not have anything scheduled with Dr Roda Shutters. If he s doing well next visit we may consider discharge. If he feels like he needs a little more to reach retrun to work we may extend    Personal Factors and Comorbidities Comorbidity 2    Comorbidities rib fracture, concussion like symptoms    Examination-Activity Limitations Bend;Lift;Stand;Stairs;Squat;Sit;Locomotion Level    Examination-Participation Restrictions Driving;Community Activity    Stability/Clinical Decision Making Evolving/Moderate complexity    Clinical Decision Making Moderate    Rehab Potential Good    PT Frequency 1x / week    PT Duration 6 weeks    PT Treatment/Interventions ADLs/Self Care Home Management;Electrical Stimulation;Ultrasound;Traction;Moist Heat;Iontophoresis 4mg /ml Dexamethasone;Gait training;Stair training;Functional mobility training;Therapeutic activities;Therapeutic exercise;Balance training;Neuromuscular re-education;Manual techniques;Patient/family education;Passive range of motion;Dry needling;Joint Manipulations;Spinal Manipulations;Vasopneumatic Device;Taping    PT Next Visit Plan Progress strengthening and functional mobility as tolerated    PT Home Exercise Plan [id number]: quad sets c towel under knee, LAQ c ROM for knee flex. Ice pack 10-15 mins every 2 hours as needed. : Standing TKE c ball on wall.    Consulted and Agree with Plan of Care Patient           Patient will benefit from skilled therapeutic  intervention in order to improve the following deficits and impairments:  Abnormal gait, Decreased range of motion, Difficulty walking, Pain, Decreased strength, Decreased mobility, Increased edema  Visit Diagnosis: Acute pain of left knee  Localized edema  Sprain  of medial collateral ligament of left knee, initial encounter     Problem List There are no problems to display for this patient.   Dessie Coma  PT DPT  02/11/2020, 8:50 AM  [facility] [address] [place], [place], [address] Phone: [phone]   Fax:  [phone]  Name: Saahas Hidrogo MRN: [id number] Date of Birth: Oct 04, 1980

## 2020-02-18 ENCOUNTER — Ambulatory Visit: Payer: Self-pay | Admitting: Physical Therapy

## 2020-02-29 NOTE — Progress Notes (Signed)
NEUROLOGY CONSULTATION NOTE  Devynn Scheff MRN: [id number] DOB: [birth year]  Referring provider: Pricilla Loveless, MD (ED referral) Primary care provider: No PCP  Reason for consult:  Bell's palsy  HISTORY OF PRESENT ILLNESS: William Escobar is a 39 year old right-handed male who presents for left-sided Bell's palsy with left sided neck pain.  History supplemented by ED note.  Spanish interpreter present.  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  He hit the the left side of his head, ribs, and left knee.  He says he lost consciousness for about 10 minutes.  Since then, he has had left sided neck pain with tingling as well as left knee pain, rib pain and swelling in the feet.  He sometimes feels dizzy and also reports some blurred vision, particularly difficult to see when it is dark.  The experience has traumatized him and he cannot return to work because the [employer] representative site causes him anxiety.  On 12/03/2019, he developed left-sided facial droop on 12/02/2019 (ED note states right sided facial weakness but patient maintains it was the left side).  He went to the [facility] ED the following day where exam revealed mild to moderate right sided facial droop with right ptosis.  Rest of neuro exam reportedly normal.  MRI of brain and cervical spine with and without contrast personally reviewed showed disc bulge with degenerative changes at C3-C4 with only mild canal and foraminal stenosis but otherwise no intracranial or spinal cord abnormalities.  While he was diagnosed with possible Bell's palsy, there was no explanation for the rest of his symptoms.  He was discharged on prednisone, valacylovir and Lacrilube.  Symptoms lasted about a month.  He has been undergoing physical therapy for left knee pain/MCL sprain since a fall in June.  He endorses swelling in his feet.  He does not have a PCP.  PAST MEDICAL HISTORY: No past medical history on  file.  PAST SURGICAL HISTORY: No past surgical history on file.  MEDICATIONS: Current Outpatient Medications on File Prior to Visit  Medication Sig Dispense Refill  . acetaminophen (TYLENOL) 325 MG tablet Take 2 tablets (650 mg total) by mouth every 6 (six) hours as needed for mild pain, moderate pain, fever or headache. (Patient not taking: Reported on 12/03/2019) 60 tablet 0  . artificial tears (LACRILUBE) OINT ophthalmic ointment Place into both eyes at bedtime as needed for dry eyes. 1 g 0  . HYDROcodone-acetaminophen (NORCO/VICODIN) 5-325 MG tablet Take 1 tablet by mouth every 6 (six) hours as needed for moderate pain.    Marland Kitchen ibuprofen (ADVIL) 600 MG tablet Take 1 tablet (600 mg total) by mouth every 8 (eight) hours as needed for fever, headache, mild pain or moderate pain. (Patient not taking: Reported on 12/03/2019) 60 tablet 0  . ibuprofen (ADVIL) 800 MG tablet Take 1 tablet (800 mg total) by mouth every 8 (eight) hours as needed. 30 tablet 2  . oxyCODONE-acetaminophen (PERCOCET/ROXICET) 5-325 MG tablet Take 1-2 tablets by mouth every 4 (four) hours as needed for severe pain. (Patient not taking: Reported on 12/03/2019) 6 tablet 0  . predniSONE (DELTASONE) 20 MG tablet 3 tabs po daily x 2 days, then 2 tabs x 3 days, then 1.5 tabs x 3 days, then 1 tab x 3 days, then 0.5 tabs x 3 days 24 tablet 0  . valACYclovir (VALTREX) 1000 MG tablet Take 1 tablet (1,000 mg total) by mouth 3 (three) times daily. 20 tablet 0  No current facility-administered medications on file prior to visit.    ALLERGIES: No Known Allergies  FAMILY HISTORY: No family history on file.   SOCIAL HISTORY: Social History   Socioeconomic History  . Marital status: Married    Spouse name: Not on file  . Number of children: Not on file  . Years of education: Not on file  . Highest education level: Not on file  Occupational History  . Not on file  Tobacco Use  . Smoking status: Never Smoker  . Smokeless tobacco:  Never Used  Substance and Sexual Activity  . Alcohol use: Yes  . Drug use: Yes    Types: Marijuana  . Sexual activity: Not on file  Other Topics Concern  . Not on file  Social History Narrative  . Not on file   Social Determinants of Health   Financial Resource Strain:   . Difficulty of Paying Living Expenses: Not on file  Food Insecurity:   . Worried About Programme researcher, broadcasting/film/video in the Last Year: Not on file  . Ran Out of Food in the Last Year: Not on file  Transportation Needs:   . Lack of Transportation (Medical): Not on file  . Lack of Transportation (Non-Medical): Not on file  Physical Activity:   . Days of Exercise per Week: Not on file  . Minutes of Exercise per Session: Not on file  Stress:   . Feeling of Stress : Not on file  Social Connections:   . Frequency of Communication with Friends and Family: Not on file  . Frequency of Social Gatherings with Friends and Family: Not on file  . Attends Religious Services: Not on file  . Active Member of Clubs or Organizations: Not on file  . Attends Banker Meetings: Not on file  . Marital Status: Not on file  Intimate Partner Violence:   . Fear of Current or Ex-Partner: Not on file  . Emotionally Abused: Not on file  . Physically Abused: Not on file  . Sexually Abused: Not on file    PHYSICAL EXAM: Blood pressure 131/82, pulse 86, height 5' 7.72" (1.72 m), weight 225 lb (102.1 kg), SpO2 97 %. General: No acute distress.  Patient appears well-groomed.  Head:  Normocephalic/atraumatic Eyes:  fundi examined but not visualized Neck: supple, no paraspinal tenderness, full range of motion Back: No paraspinal tenderness Heart: regular rate and rhythm Lungs: Clear to auscultation bilaterally. Vascular: No carotid bruits. Neurological Exam: Mental status: alert and oriented to person, place, and time, recent and remote memory intact, fund of knowledge intact, attention and concentration intact, speech fluent and  not dysarthric, language intact. Cranial nerves: CN I: not tested CN II: pupils equal, round and reactive to light, visual fields intact CN III, IV, VI:  full range of motion, no nystagmus, no ptosis CN V: slightly reduced pinprick sensation around the left eye. CN VII: upper and lower face symmetric CN VIII: hearing intact CN IX, X: gag intact, uvula midline CN XI: sternocleidomastoid and trapezius muscles intact CN XII: tongue midline Bulk & Tone: normal, no fasciculations. Motor:  5/5 throughout  Sensation:  Pinprick and vibration sensation intact. Deep Tendon Reflexes:  2+ throughout, toes downgoing.  Finger to nose testing:  Without dysmetria.  Heel to shin:  Without dysmetria.  Gait:  Normal station and stride.  Able to turn and tandem walk. Romberg negative.  IMPRESSION: 1.  Left sided Bell's palsy.  Incidental from his injury.  Resolved. 2.  History  of concussion from fall with residual left sided cervical myofascial pain, rib pain, left knee pain/MCL sprain, intermittent blurred vision and dizziness.    PLAN: From a neurologic standpoint, there are no concerns.  I think he needs to establish care with a PCP to address ongoing issues with neck and rib pain, as well as addressing the anxiety following the injury.  The PCP may want to consider referral for an eye exam.  He does not have insurance.  We will refer him to [facility].    Thank you for allowing me to take part in the care of this patient.  Shon Millet, DO

## 2020-03-01 ENCOUNTER — Other Ambulatory Visit: Payer: Self-pay

## 2020-03-01 ENCOUNTER — Encounter: Payer: Self-pay | Admitting: Neurology

## 2020-03-01 ENCOUNTER — Ambulatory Visit (INDEPENDENT_AMBULATORY_CARE_PROVIDER_SITE_OTHER): Payer: Self-pay | Admitting: Neurology

## 2020-03-01 VITALS — BP 131/82 | HR 86 | Ht 67.72 in | Wt 225.0 lb

## 2020-03-01 DIAGNOSIS — Z87820 Personal history of traumatic brain injury: Secondary | ICD-10-CM

## 2020-03-01 DIAGNOSIS — G510 Bell's palsy: Secondary | ICD-10-CM

## 2020-03-01 DIAGNOSIS — M542 Cervicalgia: Secondary | ICD-10-CM

## 2020-03-01 NOTE — Patient Instructions (Signed)
1. Creo que la debilidad facial fue un tema aparte de la cada. Es una parlisis de [place]. Vea abajo para ms informacin. 2. Tiene sntomas residuales de la cada, como dolor de cuello, dolor en las costillas y visin borrosa. Recomendara establecer la atencin con un proveedor de Marine scientist. Lo referiremos a [employer]. Tambin recomendara que lo contactaran con un oculista para que revisara su visin.    Parlisis facial en los adultos Bell Palsy, Adult  La parlisis facial es una incapacidad a corto plazo para mover los msculos de una parte del rostro. La incapacidad del movimiento (parlisis) es el resultado de la inflamacin o compresin del nervio facial, que recorre el crneo y la parte inferior de las orejas hacia el costado del rostro (sptimo nervio craneal). Este nervio es responsable de los movimientos faciales, que incluyen parpadear, cerrar los ojos, sonrer y Development worker, community. Cules son las causas? Se desconoce la causa exacta de esta afeccin. Puede ser causada por una infeccin de un virus, como el virus de la varicela (herpes zster), de Epstein-Barr o de las paperas. Qu incrementa el riesgo? Es ms probable que usted sufra esta afeccin si:  Est embarazada.  Tiene diabetes.  Recientemente ha tenido una infeccin en la nariz, la garganta o las vas respiratorias (infeccin de las vas respiratorias superiores).  Tiene debilitado el sistema de defensa del organismo (sistema inmunitario).  Tuvo una lesin facial, como una fractura.  Tiene antecedentes familiares de parlisis facial. Cules son los signos o los sntomas? Los sntomas de esta afeccin [employer] siguientes:  Debilidad en un lado del rostro.  Cada del prpado y de la comisura de la boca.  Exceso de lagrimeo en uno de los ojos.  Dificultad para cerrar el prpado.  Venita Sheffield.  Sequedad en la boca.  Cambios en el sentido del gusto.  Cambio en el aspecto  facial.  Dolor detrs de [place].  Zumbido en una o ambas orejas.  Sensibilidad al ruido en [place].  Contraccin facial.  Dolor de cabeza.  Trastornos del habla.  Mareos.  Dificultad para comer o beber. La mayor parte del [place], solo una parte del rostro se ve afectada. Rara vez, la parlisis facial afecta todo el rostro. Cmo se diagnostica? Esta afeccin se diagnostica en funcin de lo siguiente:  Sus sntomas.  Sus antecedentes mdicos.  Un examen fsico. Es posible que, adems, deba consultar a un especialista en trastornos de los nervios (neurlogo) o enfermedades y afecciones oculares (oftalmlogo). Pueden hacerle estudios, por ejemplo:  Una prueba para controlar si hubo dao nervioso (electromiograma).  Estudios de diagnstico por imgenes, como exploracin por tomografa computarizada (TC) o Health visitor (RM).  Anlisis de [place]. Cmo se trata? Esta afeccin afecta a cada persona de un modo diferente. A veces, los sntomas desaparecen sin tratamiento en el plazo de unas semanas. Si el tratamiento es necesario, vara de persona a Social worker. El objetivo del tratamiento es reducir la inflamacin y proteger los ojos para que no se daen. El tratamiento de la parlisis facial puede incluir:  Medicamentos, por ejemplo: ? Corticoesteroides para reducir la hinchazn y la inflamacin. ? Medicamentos antivirales. ? Analgsicos, como aspirina, paracetamol o ibuprofeno.  Gotas oftlmicas o ungento para [employer].  Proteccin para los ojos, si no puede cerrar el ojo.  Ejercicios o masajes para recuperar la fuerza y la funcin del msculo (fisioterapia). Siga estas indicaciones en su casa:   Tome los medicamentos de venta  libre y los Science writer se lo haya indicado el mdico.  Si el ojo est afectado: ? Aplique el ungento o las gotas oftlmicas para Pharmacologist los ojos humectados tal como se lo haya indicado el  mdico. ? Siga las indicaciones del mdico para el cuidado y Training and development officer de los ojos como se lo haya indicado el mdico.  Haga los ejercicios de fisioterapia como se lo haya indicado el mdico.  Concurra a todas las visitas de control como se lo haya indicado el mdico. Esto es importante. Comunquese con un mdico si:  Tiene fiebre.  Los sntomas no mejoran en un lapso de 2 a 3semanas, o empeoran.  El ojo est rojo, irritado o le duele.  Aparecen nuevos sntomas. Solicite ayuda de inmediato si:  Siente debilidad o adormecimiento en alguna parte del cuerpo que no sea el rostro.  Tiene dificultad para tragar.  Tiene dolor o rigidez en el cuello.  Tiene mareos o Company secretary. Resumen  La parlisis facial es una incapacidad a corto plazo para mover los msculos de una parte del rostro. La incapacidad para moverse (parlisis) es el resultado de la inflamacin o la compresin del nervio facial.  Esta afeccin afecta a cada persona de un modo diferente. A veces, los sntomas desaparecen sin tratamiento en el plazo de unas semanas.  Si el tratamiento es necesario, vara de persona a Social worker. El objetivo del tratamiento es reducir la inflamacin y proteger los ojos para que no se daen.  Comunquese con el mdico si los sntomas no mejoran en el lapso de 2 a 3semanas, o empeoran. Esta informacin no tiene Theme park manager el consejo del mdico. Asegrese de hacerle al mdico cualquier pregunta que tenga. Document Revised: 07/25/2017 Document Reviewed: 11/04/2016 Elsevier Patient Education  2020 [employer].

## 2020-03-07 ENCOUNTER — Other Ambulatory Visit: Payer: Self-pay

## 2020-03-07 ENCOUNTER — Ambulatory Visit: Payer: Self-pay | Attending: Orthopaedic Surgery

## 2020-03-07 ENCOUNTER — Encounter: Payer: Self-pay | Admitting: Orthopaedic Surgery

## 2020-03-07 DIAGNOSIS — S83412A Sprain of medial collateral ligament of left knee, initial encounter: Secondary | ICD-10-CM | POA: Insufficient documentation

## 2020-03-07 DIAGNOSIS — R600 Localized edema: Secondary | ICD-10-CM | POA: Insufficient documentation

## 2020-03-07 DIAGNOSIS — M25562 Pain in left knee: Secondary | ICD-10-CM | POA: Insufficient documentation

## 2020-03-07 NOTE — Therapy (Signed)
[facility] [address] [place], [place], [address] Phone: [phone]   Fax:  [phone]  Physical Therapy Treatment/Re- Certification  Patient Details  Name: William Escobar MRN: [id number] Date of Birth: May 04, 1980 Referring Provider (PT): Dr. Gershon Mussel    Encounter Date: 03/07/2020   PT End of Session - 03/07/20 0939    Visit Number 11    Number of Visits 12    Date for PT Re-Evaluation 03/03/20    PT Start Time 0916    PT Stop Time 1000    PT Time Calculation (min) 44 min    Activity Tolerance Patient tolerated treatment well    Behavior During Therapy [facility] for tasks assessed/performed           Past Medical History:  Diagnosis Date  . Bell's palsy     History reviewed. No pertinent surgical history.  There were no vitals filed for this visit.   Subjective Assessment - 03/07/20 0919    Subjective Pt. reports he has returned to work light duty, but with a trial of climbing a ladder and standing on a rung he had a great deal of L knee pain. He also reports the L knee bothers when he lands hard on his L heel. Pt continues to report R rib pain which is aggrevated by hammering. Pt rates the R rib pain 8/10. After the PT session, pt rated his L knee a 7/10. Pt states he has been not able to get another MD appt until January.    Pertinent History probably concussion and rib fx    Patient Stated Goals decrease pain, be 100%, get back to work    Currently in Pain? Yes   8/10 climbing and standing on a ladder   Pain Score 5     Pain Location Knee    Pain Orientation Right    Pain Descriptors / Indicators Aching    Pain Type Chronic pain    Pain Onset More than a month ago    Pain Frequency Constant    Aggravating Factors  climbing a ladder and steps    Pain Relieving Factors Medication    Effect of Pain on Daily Activities Working light duty              [facility] PT Assessment - 03/07/20 0001      AROM    Left Knee Extension 0    Left Knee Flexion 132      Strength   Right/Left Knee Left    Left Knee Flexion 4+/5    Left Knee Extension 4+/5                         [facility] Adult PT Treatment/Exercise - 03/07/20 0001      Exercises   Exercises Knee/Hip      Knee/Hip Exercises: Aerobic   Nustep 6 mins; L6: arms and legs      Knee/Hip Exercises: Standing   Lateral Step Up Left;2 sets;15 reps;Hand Hold: 0;Step Height: 6"    Forward Step Up Left;2 sets;15 reps;Hand Hold: 0    Wall Squat 2 sets;15 reps;5 seconds    Other Standing Knee Exercises Kettle bell lifts from 2'" platform, 15# 15x, 25# 15x                    PT Short Term Goals - 03/07/20 1022      PT SHORT TERM GOAL #3   Title Pt  will exhibit decreased TTP in his knee so MMT can be performed to assess LE strength. To be assessed. Achieved    Status Achieved    Target Date 03/07/20      PT SHORT TERM GOAL #4   Title Pt will demonstrate proper use and wear of knee brace to provide him the most stability.On-going- Pt has not brought a knee brace torecent PT sessions.Achieved- Pt is out of the knee brace.    Target Date 03/07/20             PT Long Term Goals - 03/07/20 1023      PT LONG TERM GOAL #1   Title Pt will be able to ascend and descend 10 stairs with proper mechanics and no complaints of pain. On-going- Able to complete s handrail, but with pain    Status On-going    Target Date 04/28/20      PT LONG TERM GOAL #2   Title Pt will be able to squat 25 lbs with proper mechanics to be able to go back to work. On-going- Able ro complete, but with pain    Status On-going    Target Date 04/28/20      PT LONG TERM GOAL #3   Title Pt will be able to perform 30 sec SLS on L leg indicating increased stability. Not re-assessed    Status On-going    Target Date 04/28/20      PT LONG TERM GOAL #4   Title Pt will be able to ambulate 1000' with proper gait mechanics and no reports of pain to be  able to return to work. On-going: Pt walks with an appropriate gait pattern, but is painful    Status On-going    Target Date 04/28/20                 Plan - 03/07/20 1038    Clinical Impression Statement Pt returns to PT with pt reporting he has started working light duty. He reports significant L knee pain c a trial of climbing and standing on the rung of a ladder and significant R rib pain with hammering. Pt completed repetitive standing and lifting exs demonstrating good strength and quality of movement c  the L knee.Today, pt was able to complete lateral and forward step ups without use of hands for assist which is an improvement over the last PT session. Pt's L knee ROM is WNLs. With MMT L knee ext and flex demonstrated 4+/5 strength, being limited by pain. Pt's functional use of the L knee has made good improvement, however L knee pain continues to impact his ability to complete higher demand activities asscoiated with his job. Pt will benefit from additional PT services to improve L LE strength and address tolerance to work related activities to maximize the functional use of his L LE and his return to his full duty at work.    Personal Factors and Comorbidities Comorbidity 2    Comorbidities rib fracture, concussion like symptoms    Examination-Activity Limitations Bend;Lift;Stand;Stairs;Squat;Sit;Locomotion Level    Examination-Participation Restrictions Driving;Community Activity    Stability/Clinical Decision Making Evolving/Moderate complexity    Clinical Decision Making Moderate    Rehab Potential Good    PT Frequency 1x / week    PT Duration 6 weeks    PT Treatment/Interventions ADLs/Self Care Home Management;Electrical Stimulation;Ultrasound;Traction;Moist Heat;Iontophoresis 4mg /ml Dexamethasone;Gait training;Stair training;Functional mobility training;Therapeutic activities;Therapeutic exercise;Balance training;Neuromuscular re-education;Manual techniques;Patient/family  education;Passive range of motion;Dry needling;Joint Manipulations;Spinal Manipulations;Vasopneumatic Device;Taping  PT Next Visit Plan Continue PT 1w6 to progress strengthening and functional mobility as tolerated    PT Home Exercise Plan [id number]: quad sets c towel under knee, LAQ c ROM for knee flex. Ice pack 10-15 mins every 2 hours as needed. [id number]: Standing TKE c ball on wall.    Consulted and Agree with Plan of Care Patient           Patient will benefit from skilled therapeutic intervention in order to improve the following deficits and impairments:  Abnormal gait, Decreased range of motion, Difficulty walking, Pain, Decreased strength, Decreased mobility, Increased edema  Visit Diagnosis: Acute pain of left knee  Localized edema  Sprain of medial collateral ligament of left knee, initial encounter     Problem List There are no problems to display for this patient.   Joellyn Rued MS, PT 03/07/20 3:57 PM  [facility] [address] [place], [place], [address] Phone: [phone]   Fax:  [phone]  Name: William Escobar MRN: [id number] Date of Birth: 1980/05/16

## 2020-03-14 ENCOUNTER — Ambulatory Visit: Payer: Self-pay | Admitting: Internal Medicine

## 2020-03-29 ENCOUNTER — Ambulatory Visit: Payer: Self-pay | Attending: Orthopaedic Surgery | Admitting: Physical Therapy

## 2020-03-29 DIAGNOSIS — S83412A Sprain of medial collateral ligament of left knee, initial encounter: Secondary | ICD-10-CM | POA: Insufficient documentation

## 2020-03-29 DIAGNOSIS — M25562 Pain in left knee: Secondary | ICD-10-CM | POA: Insufficient documentation

## 2020-03-29 DIAGNOSIS — R600 Localized edema: Secondary | ICD-10-CM | POA: Insufficient documentation

## 2020-03-30 ENCOUNTER — Encounter: Payer: Self-pay | Admitting: Orthopaedic Surgery

## 2020-03-30 ENCOUNTER — Ambulatory Visit: Payer: Self-pay

## 2020-03-30 ENCOUNTER — Other Ambulatory Visit: Payer: Self-pay

## 2020-03-30 ENCOUNTER — Ambulatory Visit (INDEPENDENT_AMBULATORY_CARE_PROVIDER_SITE_OTHER): Payer: Self-pay | Admitting: Orthopaedic Surgery

## 2020-03-30 VITALS — Ht 67.0 in | Wt 225.0 lb

## 2020-03-30 DIAGNOSIS — R600 Localized edema: Secondary | ICD-10-CM

## 2020-03-30 DIAGNOSIS — S83412A Sprain of medial collateral ligament of left knee, initial encounter: Secondary | ICD-10-CM

## 2020-03-30 DIAGNOSIS — M25562 Pain in left knee: Secondary | ICD-10-CM

## 2020-03-30 DIAGNOSIS — G8929 Other chronic pain: Secondary | ICD-10-CM

## 2020-03-30 MED ORDER — DICLOFENAC SODIUM 75 MG PO TBEC: 75.0000 mg | DELAYED_RELEASE_TABLET | Freq: Two times a day (BID) | ORAL | 0 refills | Status: AC

## 2020-03-30 NOTE — Progress Notes (Signed)
   Office Visit Note   Patient: William Escobar           Date of Birth: 09/22/1980           MRN: [id number] Visit Date: 03/30/2020              Requested by: No referring provider defined for this encounter. PCP: Patient, No Pcp Per   Assessment & Plan: Visit Diagnoses:  1. Chronic pain of left knee     Plan: Impression is continued left knee pain concerning for questionable meniscal and MCL pathology.  He will follow up with [place] once that has been completed.  In the meantime, he will start to wear his hinged knee brace again and take a break from physical therapy.  Have also called in [place] to take as needed.  Follow-Up Instructions: Return for after MRI.   Orders:  No orders of the defined types were placed in this encounter.  No orders of the defined types were placed in this encounter.     Procedures: No procedures performed   Clinical Data: No additional findings.   Subjective: Chief Complaint  Patient presents with  . Left Knee - Follow-up    HPI is a 39 year old Spanish-speaking gentleman who is here today with an interpreter.  He has almost 6 months out from an injury to the left knee which occurred after falling off of a ladder while at work.  He was seen in our office where he was diagnosed with an MCL sprain.  He was provided with a hinged knee brace and sent to physical therapy.  He notes that his symptoms have mildly improved over the past several months.  He still has pain to the medial aspect worse with standing, going up and down stairs as well as twisting of the knee.  He denies any mechanical symptoms or instability.  He does not take any medication for this.  Review of Systems as detailed in HPI.  All other reviewed and are negative.   Objective: Vital Signs: Ht 5\' 7"  (1.702 m)   Wt 225 lb (102.1 kg)   BMI 35.24 kg/m   Physical Exam well-developed well-nourished gentleman in no acute distress.  Alert oriented x3.  Ortho Exam left knee  exam shows a trace effusion.  He has moderate tenderness along the medial joint line.  He does have increased pain with valgus stress but there is not any laxity.  He has range of motion from 0 to 110 degrees.  He is neurovascular intact distally.  Specialty Comments:  No specialty comments available.  Imaging: No new imaging   PMFS History: There are no problems to display for this patient.  Past Medical History:  Diagnosis Date  . Bell's palsy     History reviewed. No pertinent family history.  History reviewed. No pertinent surgical history. Social History   Occupational History  . Not on file  Tobacco Use  . Smoking status: Never Smoker  . Smokeless tobacco: Never Used  Vaping Use  . Vaping Use: Never used  Substance and Sexual Activity  . Alcohol use: Not Currently  . Drug use: Yes    Types: Marijuana  . Sexual activity: Not on file

## 2020-03-30 NOTE — Therapy (Addendum)
[place], [place], [address] Phone: [phone]   Fax:  [phone]  Physical Therapy Treatment/Discharge  Patient Details  Name: William Escobar MRN: [id number] Date of Birth: [birth year] Referring Provider (PT): Dr. Frankey Shown    Encounter Date: 03/30/2020   PT End of Session - 03/30/20 1902    Visit Number 12    Number of Visits 17    Date for PT Re-Evaluation 04/28/20    PT Start Time 8756    PT Stop Time 1617    PT Time Calculation (min) 39 min    Activity Tolerance Patient tolerated treatment well    Behavior During Therapy [facility] for tasks assessed/performed           Past Medical History:  Diagnosis Date  . Bell's palsy     History reviewed. No pertinent surgical history.  There were no vitals filed for this visit.   Subjective Assessment - 03/30/20 1541    Subjective Pt reports he has not returned back to work. Pt reports having an appt. c at [facility] earlier today. Pt states the pain with his L knee has not improved. He reports pain in the knee and along the medial and posterior aspects. Pt does sometimes he does not have any pain with rest.    Patient is accompained by: Interpreter   William Escobar   Pertinent History probably concussion and rib fx    Limitations Standing;Walking;Sitting;Lifting;House hold activities    Patient Stated Goals decrease pain, be 100%, get back to work    Currently in Pain? Yes    Pain Score 5    high level 7/10   Pain Location Knee    Pain Orientation Left;Medial;Posterior   inside   Pain Descriptors / Indicators Aching    Pain Type Chronic pain    Pain Onset More than a month ago    Pain Frequency Intermittent    Aggravating Factors  Climbing a ladder, up/down steps, running, extended time on his feet    Pain Relieving Factors Medication, rest    Effect of Pain on Daily Activities currently not working                              [facility] Adult PT Treatment/Exercise - 03/30/20 0001      Knee/Hip Exercises: Standing   Hip Flexion Left;15 reps;Knee bent    Hip Abduction Left;15 reps    Hip Extension Left;15 reps;Knee straight      Knee/Hip Exercises: Supine   Straight Leg Raises Left;15 reps                  PT Education - 03/30/20 1901    Education Details HEP to promote strengthening with minimized pain    Person(s) Educated Patient    Methods Explanation;Demonstration;Tactile cues;Verbal cues;Handout    Comprehension Verbalized understanding;Returned demonstration;Verbal cues required;Tactile cues required            PT Short Term Goals - 03/07/20 1022      PT SHORT TERM GOAL #3   Title Pt will exhibit decreased TTP in his knee so MMT can be performed to assess LE strength. To be assessed. Achieved    Status Achieved    Target Date 03/07/20      PT SHORT TERM GOAL #4   Title Pt will demonstrate proper use and wear of knee brace to provide him the most stability.On-going- Pt has not  brought a knee brace torecent PT sessions.Achieved- Pt is out of the knee brace.    Target Date 03/07/20             PT Long Term Goals - 03/07/20 1023      PT LONG TERM GOAL #1   Title Pt will be able to ascend and descend 10 stairs with proper mechanics and no complaints of pain. On-going- Able to complete s handrail, but with pain    Status On-going    Target Date 04/28/20      PT LONG TERM GOAL #2   Title Pt will be able to squat 25 lbs with proper mechanics to be able to go back to work. On-going- Able ro complete, but with pain    Status On-going    Target Date 04/28/20      PT LONG TERM GOAL #3   Title Pt will be able to perform 30 sec SLS on L leg indicating increased stability. Not re-assessed    Status On-going    Target Date 04/28/20      PT LONG TERM GOAL #4   Title Pt will be able to ambulate 1000' with proper gait mechanics and no reports of pain to be able to return to work. On-going: Pt  walks with an appropriate gait pattern, but is painful    Status On-going    Target Date 04/28/20                 Plan - 03/30/20 1905    Clinical Impression Statement Per Dr. Phoebe Sharps appt notes from today's visit, Pt is the take a break from PT and a MRI is to be completed to assess the L knee. Pt was provided a HEP to promote L LE stregthening while minimizing pain. Pt returned demonstration of the HEP. Pt is on hold from PT services until further notification from Dr. Phoebe Sharps office.    Personal Factors and Comorbidities Comorbidity 2    Comorbidities rib fracture, concussion like symptoms    Examination-Activity Limitations Bend;Lift;Stand;Stairs;Squat;Sit;Locomotion Level    Examination-Participation Restrictions Driving;Community Activity    Stability/Clinical Decision Making Evolving/Moderate complexity    Clinical Decision Making Moderate    Rehab Potential Good    PT Frequency 1x / week    PT Duration 6 weeks    PT Treatment/Interventions ADLs/Self Care Home Management;Electrical Stimulation;Ultrasound;Traction;Moist Heat;Iontophoresis 58m/ml Dexamethasone;Gait training;Stair training;Functional mobility training;Therapeutic activities;Therapeutic exercise;Balance training;Neuromuscular re-education;Manual techniques;Patient/family education;Passive range of motion;Dry needling;Joint Manipulations;Spinal Manipulations;Vasopneumatic Device;Taping    PT Next Visit Plan Pt is on hold per Dr. XErlinda Hongappt notes 03/30/20    [place] Agree with Plan of Care Patient           Patient will benefit from skilled therapeutic intervention in order to improve the following deficits and impairments:  Abnormal gait, Decreased range of motion, Difficulty walking, Pain, Decreased strength, Decreased mobility, Increased edema  Visit Diagnosis: Acute pain of left knee  Localized edema  Sprain of medial collateral ligament of left knee, initial  encounter     Problem List There are no problems to display for this patient.  Allen Ralls MS, PT 03/30/20 7:16 PM   PHYSICAL THERAPY DISCHARGE SUMMARY  Visits from Start of Care: 12  Current functional level related to goals / functional outcomes: Unknown: pt self DCed   Remaining deficits: Unknown: pt self DCed   Education / Equipment: HEP  Plan: Patient agrees to discharge.  Patient goals were  not met. Patient is being discharged due to not returning since the last visit.  ?????        Gar Ponto MS, PT 05/31/20 1:39 PM   [facility] [address] [place], [place], [address] Phone: [phone]   Fax:  [phone]  Name: William Escobar MRN: [id number] Date of Birth: June 26, 1980

## 2020-04-10 ENCOUNTER — Encounter: Payer: Self-pay | Admitting: Physical Therapy

## 2020-04-17 ENCOUNTER — Encounter: Payer: Self-pay | Admitting: Physical Therapy

## 2020-04-24 ENCOUNTER — Encounter: Payer: Self-pay | Admitting: Physical Therapy

## 2020-04-24 ENCOUNTER — Other Ambulatory Visit: Payer: Self-pay

## 2020-04-25 ENCOUNTER — Encounter: Payer: Self-pay | Admitting: Orthopaedic Surgery

## 2020-04-27 ENCOUNTER — Ambulatory Visit: Payer: Self-pay | Admitting: Orthopaedic Surgery

## 2020-05-17 ENCOUNTER — Ambulatory Visit: Payer: Self-pay | Admitting: Family Medicine

## 2020-05-23 ENCOUNTER — Ambulatory Visit: Payer: Self-pay | Attending: Family Medicine | Admitting: Family Medicine

## 2020-05-23 ENCOUNTER — Other Ambulatory Visit: Payer: Self-pay

## 2020-05-23 ENCOUNTER — Encounter: Payer: Self-pay | Admitting: Family Medicine

## 2020-05-23 VITALS — BP 147/85 | HR 100 | Ht 67.0 in | Wt 236.0 lb

## 2020-05-23 DIAGNOSIS — M25562 Pain in left knee: Secondary | ICD-10-CM

## 2020-05-23 DIAGNOSIS — R600 Localized edema: Secondary | ICD-10-CM

## 2020-05-23 DIAGNOSIS — G8929 Other chronic pain: Secondary | ICD-10-CM

## 2020-05-23 DIAGNOSIS — S20211A Contusion of right front wall of thorax, initial encounter: Secondary | ICD-10-CM

## 2020-05-23 MED ORDER — HYDROCHLOROTHIAZIDE 12.5 MG PO TABS
12.5000 mg | ORAL_TABLET | Freq: Every day | ORAL | 1 refills | Status: AC
Start: 2020-05-23 — End: ?

## 2020-05-23 MED ORDER — IBUPROFEN 800 MG PO TABS: 800.0000 mg | ORAL_TABLET | Freq: Three times a day (TID) | ORAL | 2 refills | Status: AC | PRN

## 2020-05-23 NOTE — Progress Notes (Signed)
Subjective:  Patient ID: William Escobar, male    DOB: 02-Jun-1980  Age: 40 y.o. MRN: [id number]  CC: New Patient (Initial Visit)   HPI [name] is a 40 year old male who presents today to establish care. 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 His blood pressure is elevated today but at his neurology visit in 02/2020 blood pressure was 131/82.  He was managed for Bell's palsy which was incidental from his injury and has resolved.  Per notes he also had a concussion from the fall with residual left-sided cervical myofascial, ribs and left knee pains with intermittent blurred vision and dizziness. Seen by orthopedic, Dr Roda Shutters in 03/2020 for left knee pain and per notes questionable meniscal and MCL pathology.  He was currently undergoing PT but stopped due to the cost as he has no insurance.  Today he complains of L knee pain, R ribcage pain. There is associated pain in his ribs when he coughs, sneezes or performs heavy activity. He wears a brace which helps.  Sometimes has intermittent dizziness but not as frequent as it was after his fall.  Complains his feet get swollen and this is new for him. Past Medical History:  Diagnosis Date  . Bell's palsy     History reviewed. No pertinent surgical history.  History reviewed. No pertinent family history.  No Known Allergies  Outpatient Medications Prior to Visit  Medication Sig Dispense Refill  . acetaminophen (TYLENOL) 325 MG tablet Take 2 tablets (650 mg total) by mouth every 6 (six) hours as needed for mild pain, moderate pain, fever or headache. 60 tablet 0  . artificial tears (LACRILUBE) OINT ophthalmic ointment Place into both eyes at bedtime as needed for dry eyes. (Patient not taking: Reported on 05/23/2020) 1 g 0  . diclofenac (VOLTAREN) 75 MG EC tablet Take 1 tablet (75 mg total) by mouth 2 (two) times  daily. (Patient not taking: Reported on 05/23/2020) 60 tablet 0  . HYDROcodone-acetaminophen (NORCO/VICODIN) 5-325 MG tablet Take 1 tablet by mouth every 6 (six) hours as needed for moderate pain. (Patient not taking: No sig reported)    . oxyCODONE-acetaminophen (PERCOCET/ROXICET) 5-325 MG tablet Take 1-2 tablets by mouth every 4 (four) hours as needed for severe pain. (Patient not taking: No sig reported) 6 tablet 0  . predniSONE (DELTASONE) 20 MG tablet 3 tabs po daily x 2 days, then 2 tabs x 3 days, then 1.5 tabs x 3 days, then 1 tab x 3 days, then 0.5 tabs x 3 days (Patient not taking: Reported on 05/23/2020) 24 tablet 0  . valACYclovir (VALTREX) 1000 MG tablet Take 1 tablet (1,000 mg total) by mouth 3 (three) times daily. (Patient not taking: Reported on 05/23/2020) 20 tablet 0  . ibuprofen (ADVIL) 800 MG tablet Take 1 tablet (800 mg total) by mouth every 8 (eight) hours as needed. (Patient not taking: Reported on 05/23/2020) 30 tablet 2   No facility-administered medications prior to visit.     ROS Review of Systems  Constitutional: Negative for activity change and appetite change.  HENT: Negative for sinus pressure and sore throat.   Eyes: Negative for visual disturbance.  Respiratory: Negative for cough, chest tightness and shortness of breath.   Cardiovascular: Positive for leg swelling. Negative for chest pain.  Gastrointestinal: Negative for abdominal distention, abdominal pain, constipation and diarrhea.  Endocrine: Negative.  Genitourinary: Negative for dysuria.  Musculoskeletal: Negative for joint swelling and myalgias.       See HPI  Skin: Negative for rash.  Allergic/Immunologic: Negative.   Neurological: Negative for weakness, light-headedness and numbness.  Psychiatric/Behavioral: Negative for dysphoric mood and suicidal ideas.    Objective:  BP (!) 147/85   Pulse 100   Ht 5\' 7"  (1.702 m)   Wt 236 lb (107 kg)   SpO2 98%   BMI 36.96 kg/m   BP/Weight 05/23/2020  03/30/2020 03/01/2020  Systolic BP 147 - 131  Diastolic BP 85 - 82  Wt. (Lbs) 236 225 225  BMI 36.96 35.24 34.5      Physical Exam Constitutional:      Appearance: He is well-developed.  Neck:     Vascular: No JVD.  Cardiovascular:     Rate and Rhythm: Normal rate.     Heart sounds: Normal heart sounds. No murmur heard.   Pulmonary:     Effort: Pulmonary effort is normal.     Breath sounds: Normal breath sounds. No wheezing or rales.  Chest:     Chest wall: No tenderness.  Abdominal:     General: Bowel sounds are normal. There is no distension.     Palpations: Abdomen is soft. There is no mass.     Tenderness: There is no abdominal tenderness.  Musculoskeletal:     Right lower leg: No edema.     Left lower leg: No edema.     Comments: Tenderness on range of motion of left knee  Neurological:     Mental Status: He is alert and oriented to person, place, and time.  Psychiatric:        Mood and Affect: Mood normal.     CMP Latest Ref Rng & Units 12/03/2019 12/03/2019 11/06/2019  Glucose 70 - 99 mg/dL 01/07/2020) 433(I) 951(O)  BUN 6 - 20 mg/dL 17 14 13   Creatinine 0.61 - 1.24 mg/dL 841(Y) 6.06(T  Sodium 135 - 145 mmol/L 142 139 139  Potassium 3.5 - 5.1 mmol/L 4.0 4.0 4.0  Chloride 98 - 111 mmol/L 106 105 103  CO2 22 - 32 mmol/L - 22 23  Calcium 8.9 - 10.3 mg/dL - 9.3 9.6  Total Protein 6.5 - 8.1 g/dL - 7.3 -  Total Bilirubin 0.3 - 1.2 mg/dL - 0.5 -  Alkaline Phos 38 - 126 U/L - 92 -  AST 15 - 41 U/L - 26 -  ALT 0 - 44 U/L - 63(H) -    Lipid Panel  No results found for: CHOL, TRIG, HDL, CHOLHDL, VLDL, LDLCALC, LDLDIRECT  CBC    Component Value Date/Time   WBC 6.9 12/03/2019 0738   RBC 4.96 12/03/2019 0738   HGB 14.6 12/03/2019 0745   HCT 43.0 12/03/2019 0745   PLT 258 12/03/2019 0738   MCV 89.3 12/03/2019 0738   MCH 28.4 12/03/2019 0738   MCHC 31.8 12/03/2019 0738   RDW 12.7 12/03/2019 0738   LYMPHSABS 2.3 12/03/2019 0738   MONOABS 0.5 12/03/2019 0738    EOSABS 1.2 (H) 12/03/2019 0738   BASOSABS 0.0 12/03/2019 0738    No results found for: HGBA1C  Assessment & Plan:  1. Contusion of rib on right side, initial encounter Post fall Advised to apply warm compress Initiate NSAID - ibuprofen (ADVIL) 800 MG tablet; Take 1 tablet (800 mg total) by mouth every 8 (eight) hours as needed.  Dispense: 30 tablet; Refill: 2  2. Pedal edema Not evident on my  exam Reduce sodium intake,, elevate feet HCTZ prn pedal edema - Basic Metabolic Panel  3. Chronic pain of left knee Post fall Uncontrolled Unable to continue PT due to cost We will place on ibuprofen Continue to follow-up with orthopedics - ibuprofen (ADVIL) 800 MG tablet; Take 1 tablet (800 mg total) by mouth every 8 (eight) hours as needed.  Dispense: 30 tablet; Refill: 2    Meds ordered this encounter  Medications  . ibuprofen (ADVIL) 800 MG tablet    Sig: Take 1 tablet (800 mg total) by mouth every 8 (eight) hours as needed.    Dispense:  30 tablet    Refill:  2  . hydrochlorothiazide (HYDRODIURIL) 12.5 MG tablet    Sig: Take 1 tablet (12.5 mg total) by mouth daily. As needed for pedal edema    Dispense:  30 tablet    Refill:  1    Follow-up: Return in about 3 months (around 08/21/2020) for Follow-up on rib pain.       Hoy Register, MD, FAAFP. [facility] [place], [place] [phone]   05/23/2020, 12:30 PM

## 2020-05-23 NOTE — Progress Notes (Signed)
Had a fall in June of 21 and having pain in ribs and knees.  Feet are swelling.

## 2020-05-24 LAB — BASIC METABOLIC PANEL
BUN/Creatinine Ratio: 18 (ref 9–20)
BUN: 13 mg/dL (ref 6–20)
CO2: 18 mmol/L — ABNORMAL LOW (ref 20–29)
Calcium: 9.7 mg/dL (ref 8.7–10.2)
Chloride: 101 mmol/L (ref 96–106)
Creatinine, Ser: 0.74 mg/dL — ABNORMAL LOW (ref 0.76–1.27)
GFR calc Af Amer: 136 mL/min/{1.73_m2} (ref >59)
GFR calc non Af Amer: 118 mL/min/{1.73_m2} (ref >59)
Glucose: 109 mg/dL — ABNORMAL HIGH (ref 65–99)
Potassium: 4.5 mmol/L (ref 3.5–5.2)
Sodium: 142 mmol/L (ref 134–144)

## 2020-06-02 ENCOUNTER — Telehealth: Payer: Self-pay

## 2020-06-02 NOTE — Telephone Encounter (Signed)
-----   Message from Hoy Register, MD sent at 05/24/2020 12:43 PM EST ----- Please inform the patient that labs are normal. Thank you.

## 2020-06-02 NOTE — Telephone Encounter (Signed)
Pt was called and a VM was left informing pt of normal results. 

## 2020-08-06 IMAGING — MR MR HEAD WO/W CM
7 of 13 series · 21 of 48 positions shown · IV contrast (9 ML GAD)
Comparison: None.

CLINICAL DATA: Left-sided numbness

EXAM:
MRI HEAD WITHOUT AND WITH CONTRAST
TECHNIQUE: Multiplanar, multiecho pulse sequences of the brain and surrounding
structures were obtained without and with intravenous contrast.
CONTRAST:  9mL GADAVIST GADOBUTROL 1 MMOL/ML IV SOLN

[Series 3: DWI · axial · 3.0mm · 0.94mm/px · z∈[-79,+78]mm · 6 of 110 slices shown (1 of 2)]
[im 1/110]
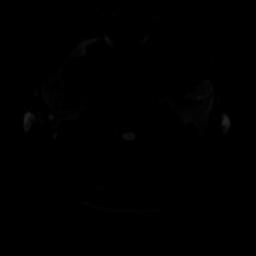
[im 22/110]
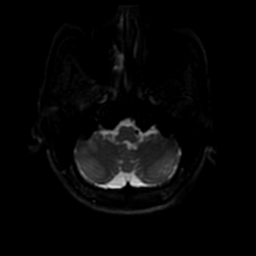
[im 44/110]
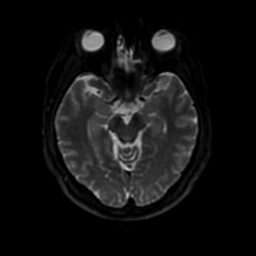
[im 66/110]
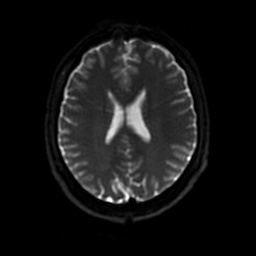
[im 88/110]
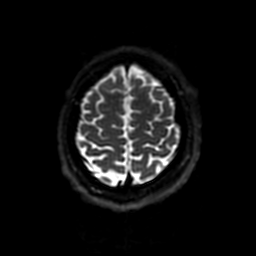
[im 110/110]
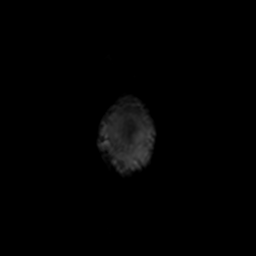

[Series 4: DWI · axial · 4.0mm · 0.94mm/px · z∈[-79,+83]mm · 5 of 70 slices shown (2 of 2)]
[im 1/70]
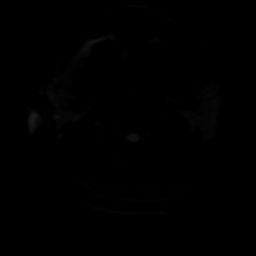
[im 18/70]
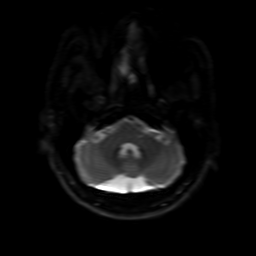
[im 35/70]
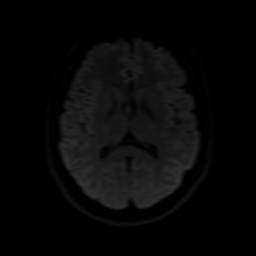
[im 52/70]
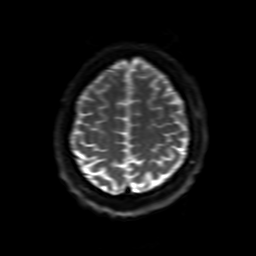
[im 70/70]
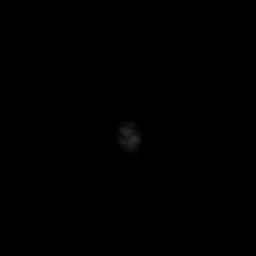

[Series 5: FLAIR · sagittal · 5.0mm · 0.23mm/px · 2 of 26 slices shown (1 of 2)]
[im 1/26]
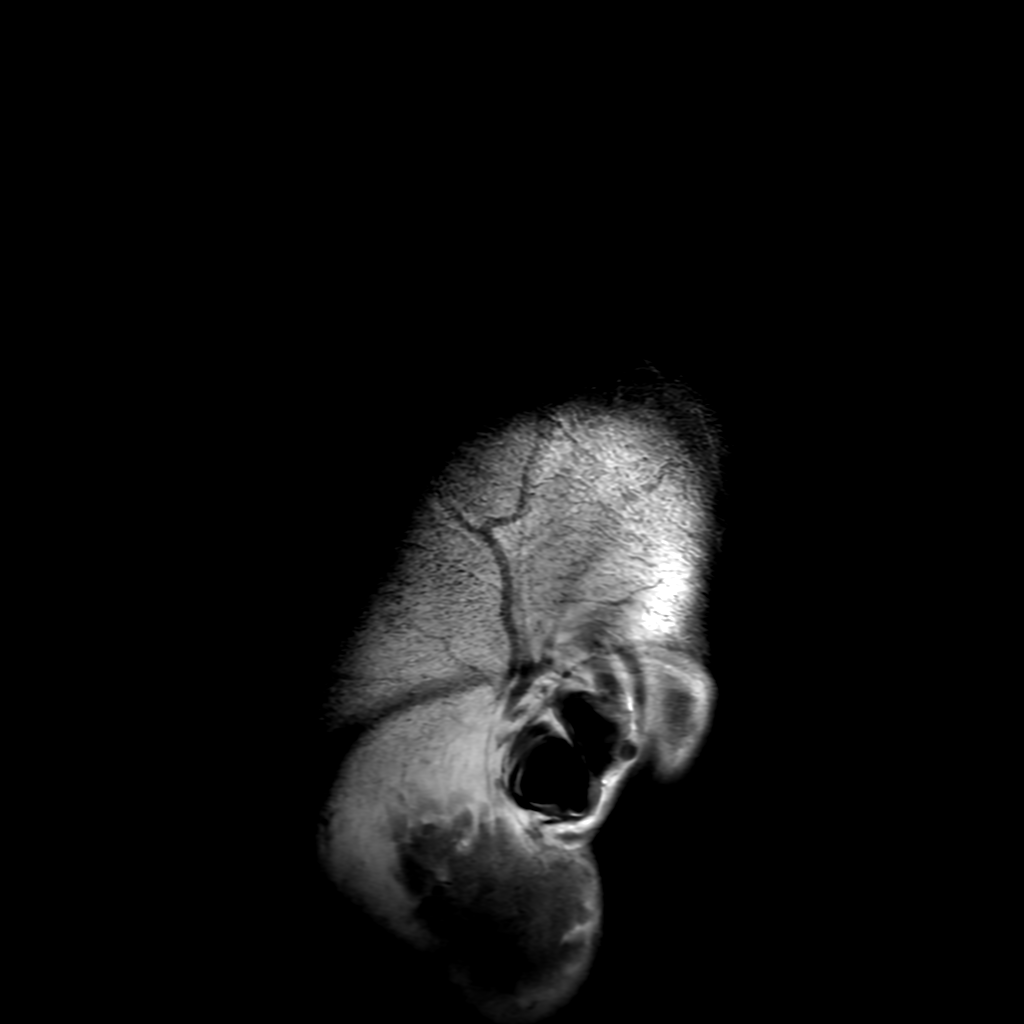
[im 26/26]
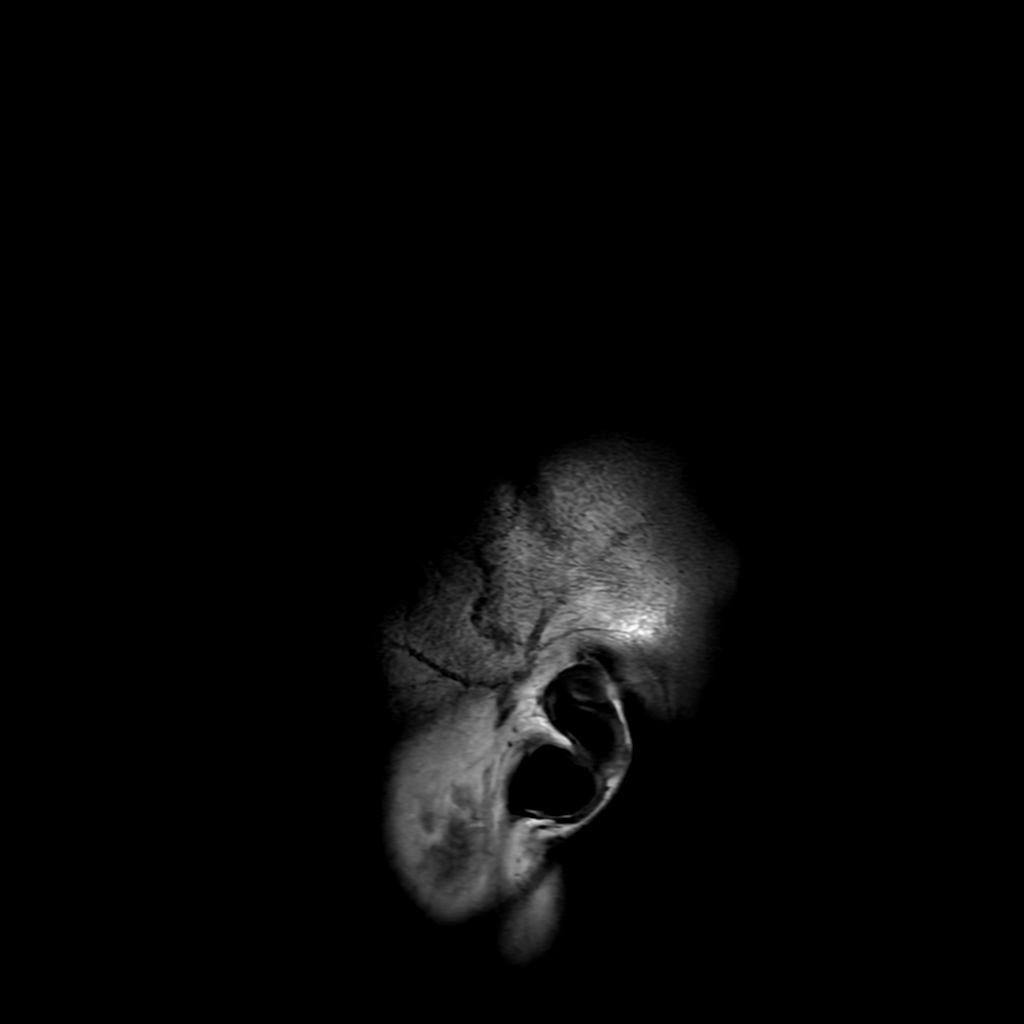

[Series 6: T2 · axial · 5.0mm · 0.23mm/px · 1 of 29 slices shown]
[im 1/29]
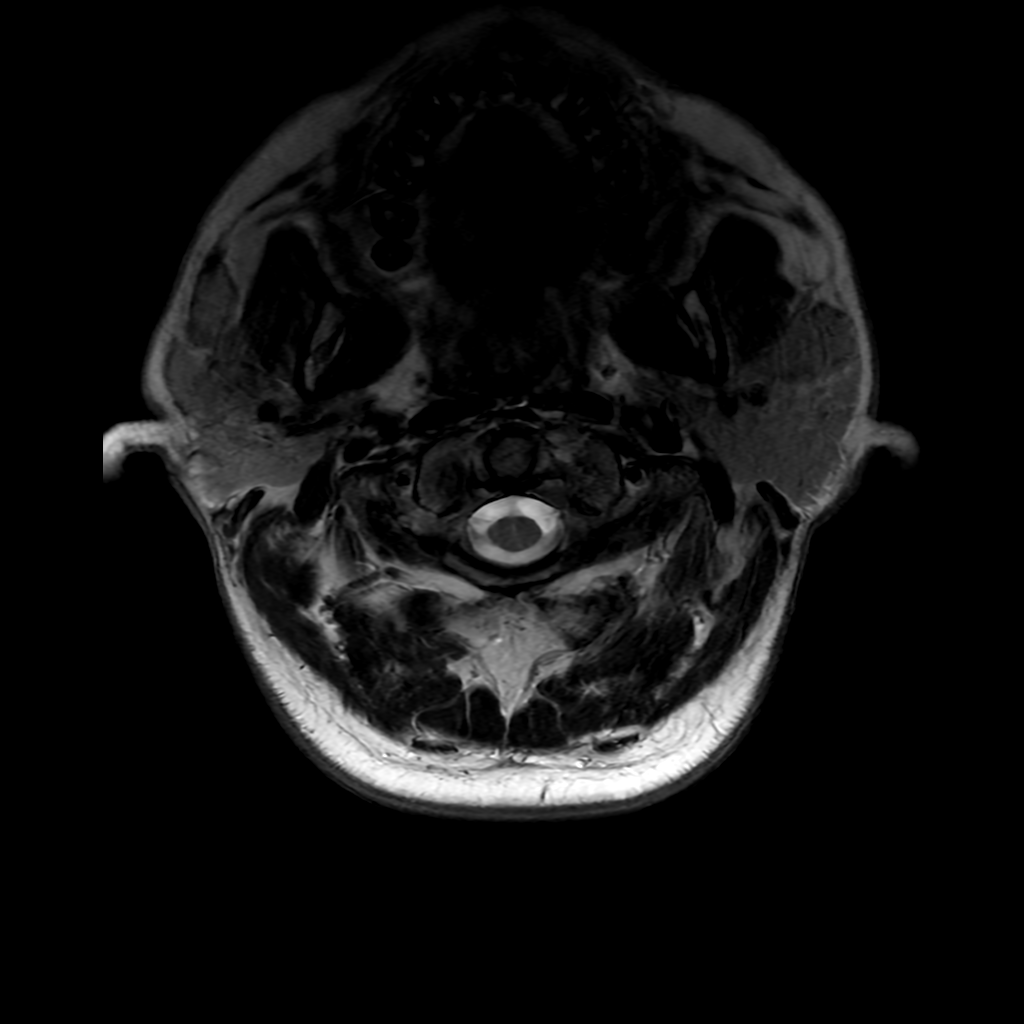

[Series 7: FLAIR · axial · 3.0mm · 0.45mm/px · z∈[-78,+78]mm · 2 of 28 slices shown (2 of 2)]
[im 1/28]
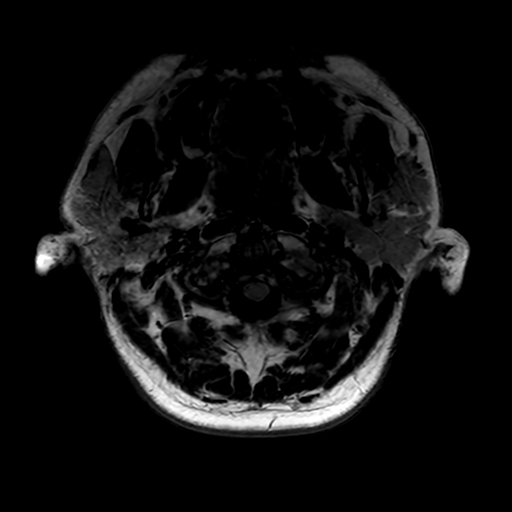
[im 28/28]
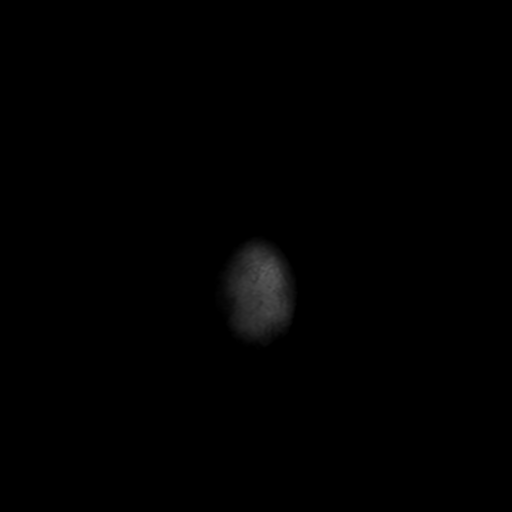

[Series 350: ADC · axial · 3.0mm · 0.94mm/px · z∈[-79,+78]mm · 3 of 54 slices shown (1 of 2)]
[im 1/54]
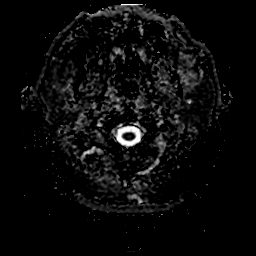
[im 27/54]
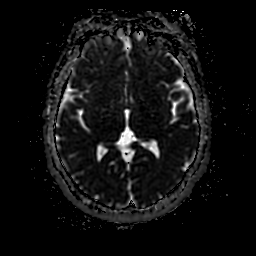
[im 54/54]
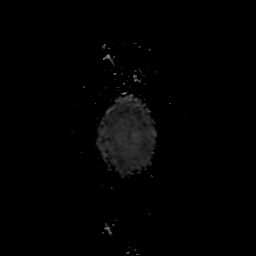

[Series 450: ADC · axial · 4.0mm · 0.94mm/px · z∈[-79,+83]mm · 2 of 35 slices shown (2 of 2)]
[im 1/35]
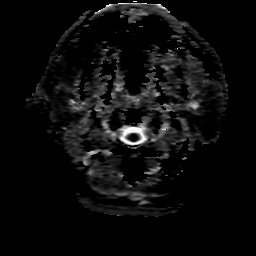
[im 35/35]
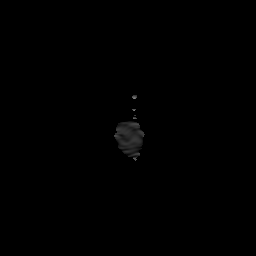

[21 of 48 positions shown; findings below may reference images not displayed]

FINDINGS: Brain: There is no acute infarction or intracranial hemorrhage.
There is no intracranial mass, mass effect, or edema. There is no
hydrocephalus or extra-axial fluid collection. Ventricles and sulci
within normal limits in size and configuration. No abnormal
enhancement.

Vascular: Major vessel flow voids at the skull base are preserved.

Skull and upper cervical spine: Normal marrow signal is preserved.

Sinuses/Orbits: Mild mucosal thickening.  Orbits are unremarkable.

Other: Sella is unremarkable.  Mastoid air cells are clear.
IMPRESSION: No evidence of recent infarction, hemorrhage, or mass.

## 2020-08-06 IMAGING — MR MR CERVICAL SPINE WO/W CM
5 of 9 series · 19 of 48 positions shown · IV contrast (gadavist)
Comparison: Of some but imaging

CLINICAL DATA: Left-sided numbness

EXAM:
MRI CERVICAL SPINE WITHOUT AND WITH CONTRAST
TECHNIQUE: Multiplanar and multiecho pulse sequences of the cervical spine, to
include the craniocervical junction and cervicothoracic junction,
were obtained without and with intravenous contrast.
CONTRAST:  9mL GADAVIST GADOBUTROL 1 MMOL/ML IV SOLN

[Series 11: T2 · sagittal · 3.0mm · 0.35mm/px · 3 of 16 slices shown (1 of 2)]
[im 1/16]
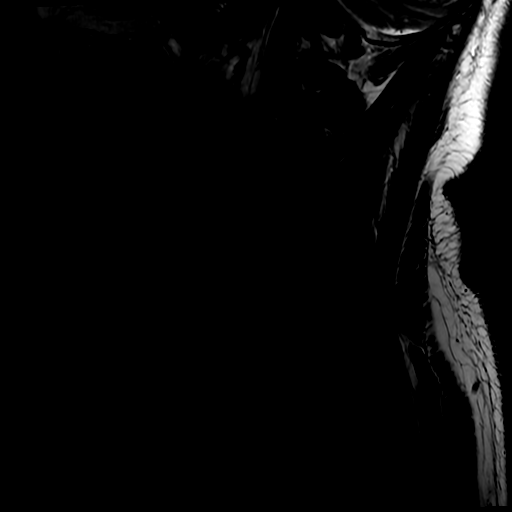
[im 8/16]
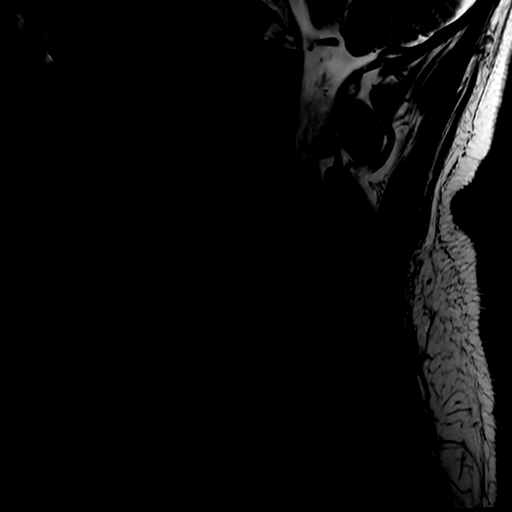
[im 16/16]
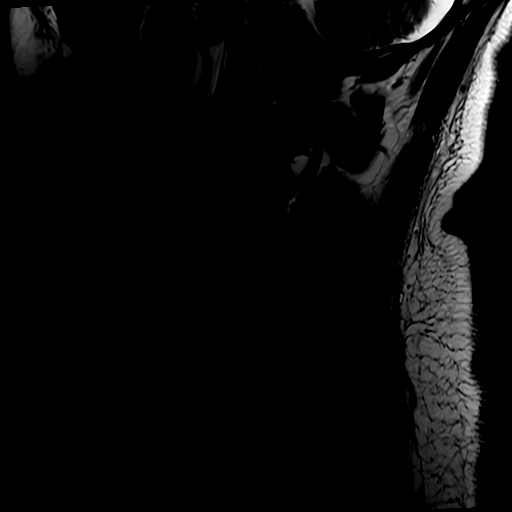

[Series 15: T2 · axial · 3.0mm · 0.35mm/px · z∈[-201,-66]mm · 6 of 42 slices shown (2 of 2)]
[im 1/42]
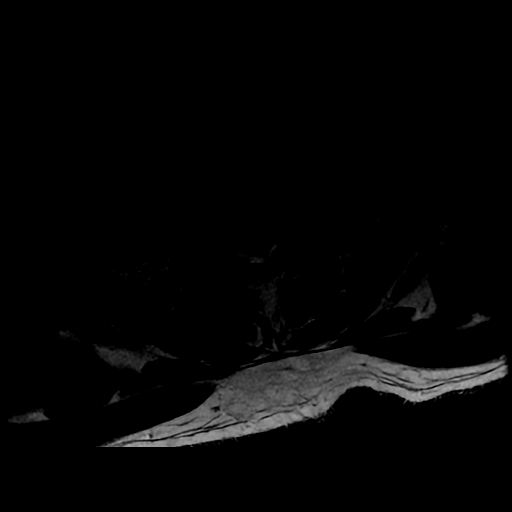
[im 9/42]
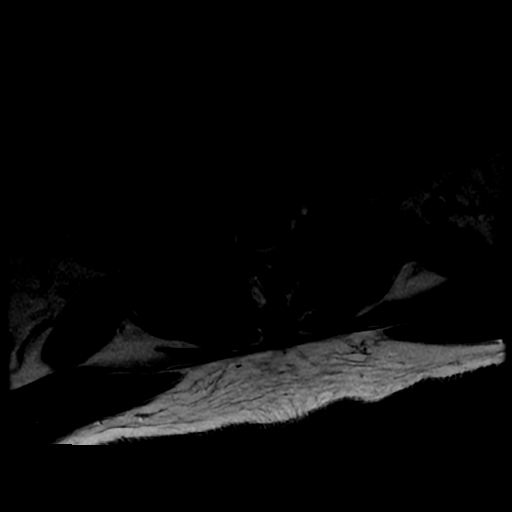
[im 17/42]
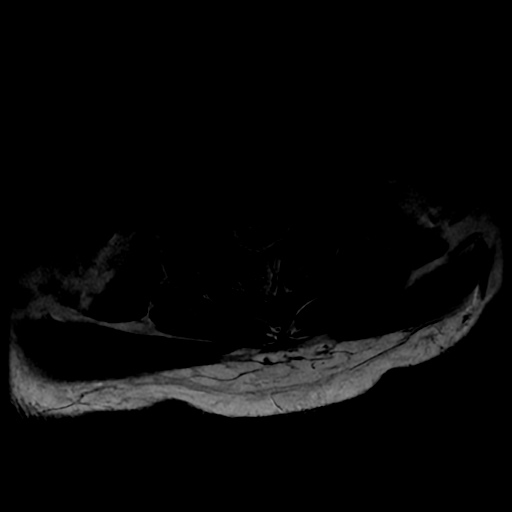
[im 25/42]
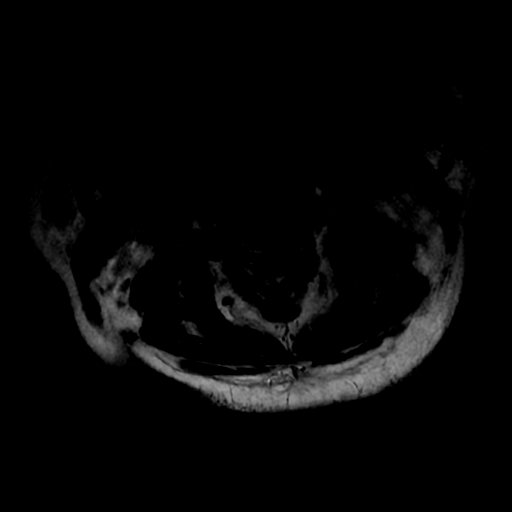
[im 33/42]
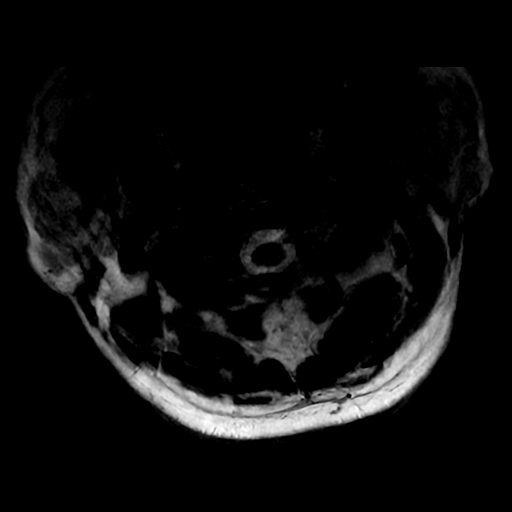
[im 42/42]
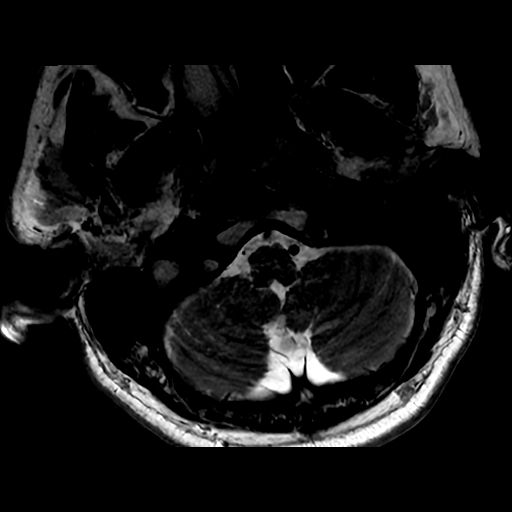

[Series 16: T1 · axial · non-contrast · 3.0mm · 0.35mm/px · z∈[-201,-66]mm · 6 of 42 slices shown]
[im 1/42]
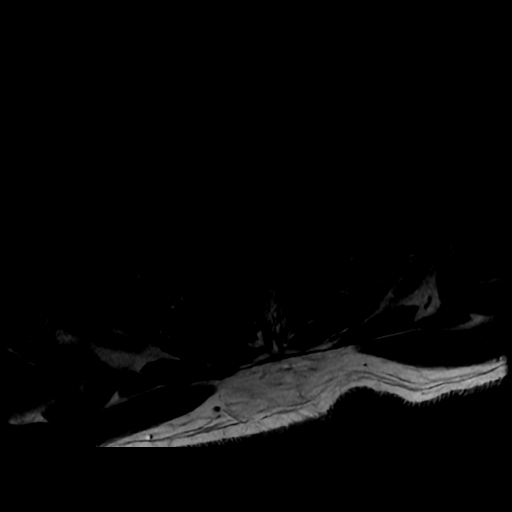
[im 9/42]
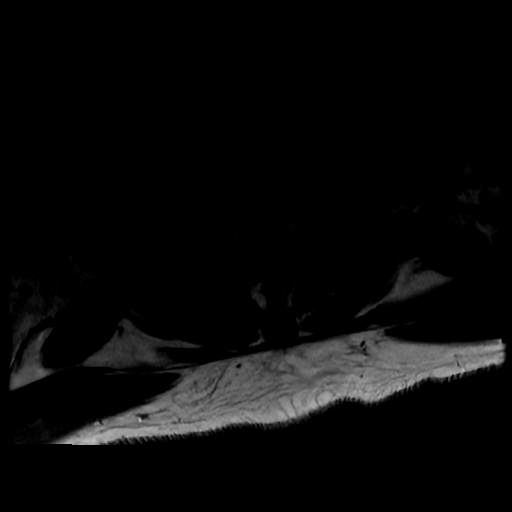
[im 17/42]
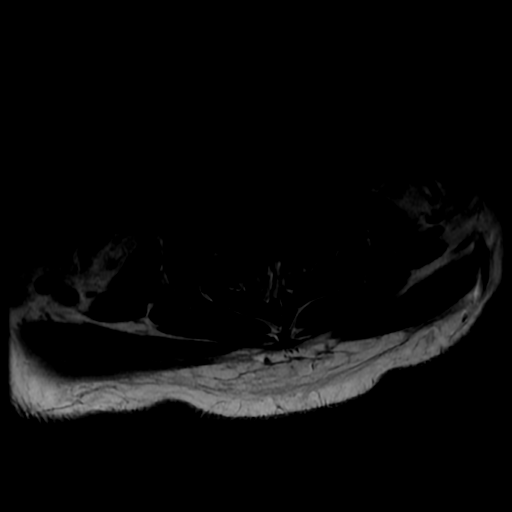
[im 25/42]
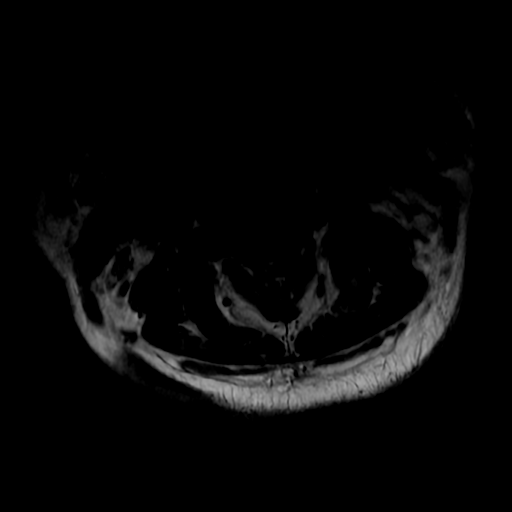
[im 33/42]
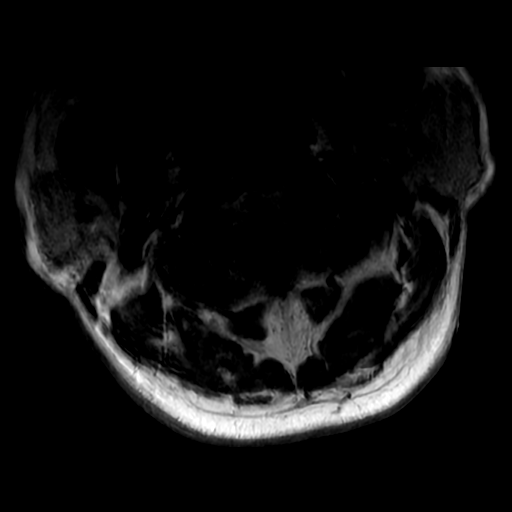
[im 42/42]
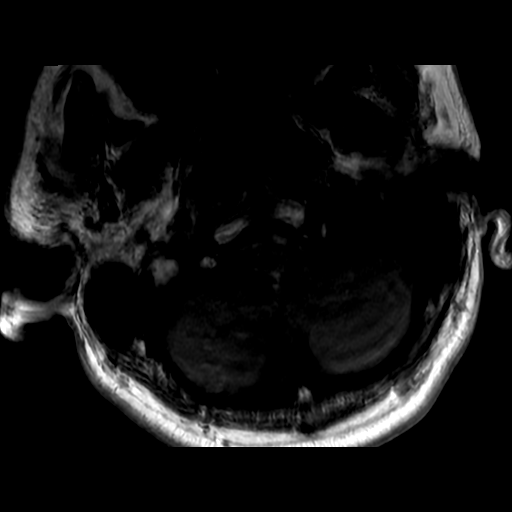

[Series 17: T1 fat-sat post-contrast · sagittal · 3.0mm · 0.35mm/px · 2 of 16 slices shown (1 of 2)]
[im 1/16]
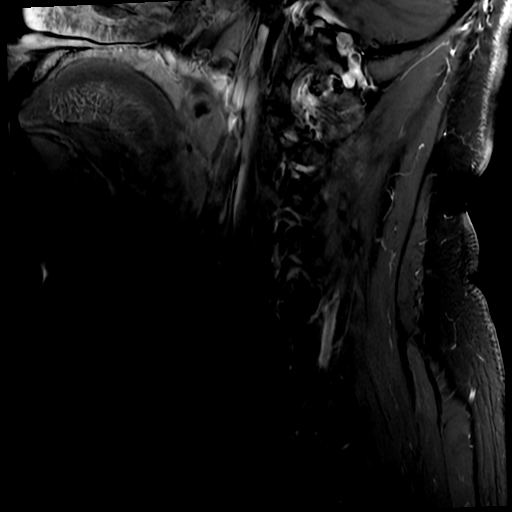
[im 16/16]
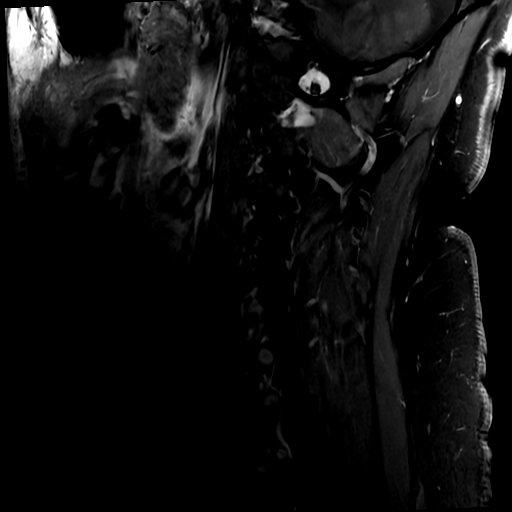

[Series 19: T1 fat-sat post-contrast · sagittal · 3.0mm · 0.35mm/px · 2 of 16 slices shown (2 of 2)]
[im 1/16]
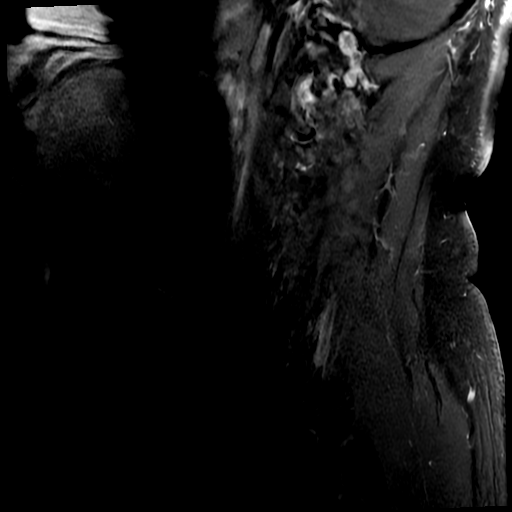
[im 16/16]
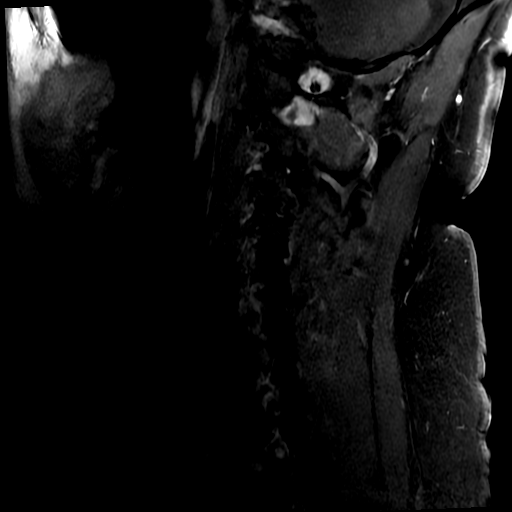

[19 of 48 positions shown; findings below may reference images not displayed]

FINDINGS: Motion artifact is present.

Alignment: Anteroposterior alignment is maintained.

Vertebrae: Vertebral body heights are maintained apart from minor
degenerative endplate irregularity. There is no edema identified.

Cord: No definite abnormal cord T2 signal within the above
limitation. There is no abnormal enhancement identified.

Posterior Fossa, vertebral arteries, paraspinal tissues:
Unremarkable.

Disc levels:

At C3-C4, there is a disc bulge with endplate osteophytes and
uncovertebral hypertrophy. Mild canal stenosis. There is at least
mild foraminal stenosis.
IMPRESSION: Motion degraded study. No definite abnormal cord signal. No abnormal
enhancement.

Degenerative changes at C3-C4.

## 2020-08-21 ENCOUNTER — Ambulatory Visit: Payer: Self-pay | Admitting: Family Medicine

## 2022-12-26 ENCOUNTER — Encounter (HOSPITAL_BASED_OUTPATIENT_CLINIC_OR_DEPARTMENT_OTHER): Payer: Self-pay | Admitting: Emergency Medicine

## 2022-12-26 ENCOUNTER — Emergency Department (HOSPITAL_BASED_OUTPATIENT_CLINIC_OR_DEPARTMENT_OTHER)
Admission: EM | Admit: 2022-12-26 | Discharge: 2022-12-27 | Disposition: A | Discharge status: Home / Self Care | Payer: Self-pay | Attending: Emergency Medicine | Admitting: Emergency Medicine

## 2022-12-26 ENCOUNTER — Emergency Department (HOSPITAL_BASED_OUTPATIENT_CLINIC_OR_DEPARTMENT_OTHER): Payer: Self-pay

## 2022-12-26 DIAGNOSIS — Z79899 Other long term (current) drug therapy: Secondary | ICD-10-CM | POA: Insufficient documentation

## 2022-12-26 DIAGNOSIS — D2321 Other benign neoplasm of skin of right ear and external auricular canal: Secondary | ICD-10-CM | POA: Insufficient documentation

## 2022-12-26 LAB — CBC WITH DIFFERENTIAL/PLATELET
Abs Immature Granulocytes: 0.02 10*3/uL (ref 0.00–0.07)
Basophils Absolute: 0.1 10*3/uL (ref 0.0–0.1)
Basophils Relative: 1 %
Eosinophils Absolute: 0.8 10*3/uL — ABNORMAL HIGH (ref 0.0–0.5)
Eosinophils Relative: 11 %
HCT: 38.6 % — ABNORMAL LOW (ref 39.0–52.0)
Hemoglobin: 13.4 g/dL (ref 13.0–17.0)
Immature Granulocytes: 0 %
Lymphocytes Relative: 30 %
Lymphs Abs: 2 10*3/uL (ref 0.7–4.0)
MCH: 31.2 pg (ref 26.0–34.0)
MCHC: 34.7 g/dL (ref 30.0–36.0)
MCV: 89.8 fL (ref 80.0–100.0)
Monocytes Absolute: 0.5 10*3/uL (ref 0.1–1.0)
Monocytes Relative: 7 %
Neutro Abs: 3.5 10*3/uL (ref 1.7–7.7)
Neutrophils Relative %: 51 %
Platelets: 251 10*3/uL (ref 150–400)
RBC: 4.3 MIL/uL (ref 4.22–5.81)
RDW: 14 % (ref 11.5–15.5)
WBC: 6.9 10*3/uL (ref 4.0–10.5)
nRBC: 0 % (ref 0.0–0.2)

## 2022-12-26 LAB — BASIC METABOLIC PANEL
Anion gap: 9 (ref 5–15)
BUN: 19 mg/dL (ref 6–20)
CO2: 25 mmol/L (ref 22–32)
Calcium: 8.6 mg/dL — ABNORMAL LOW (ref 8.9–10.3)
Chloride: 105 mmol/L (ref 98–111)
Creatinine, Ser: 0.59 mg/dL — ABNORMAL LOW (ref 0.61–1.24)
GFR, Estimated: >60 mL/min (ref >60)
Glucose, Bld: 99 mg/dL (ref 70–99)
Potassium: 3.7 mmol/L (ref 3.5–5.1)
Sodium: 139 mmol/L (ref 135–145)

## 2022-12-26 MED ORDER — KETOROLAC TROMETHAMINE 30 MG/ML IJ SOLN
30.0000 mg | Freq: Once | INTRAMUSCULAR | Status: AC
  Administered 2022-12-26: 30 mg via INTRAMUSCULAR
  Filled 2022-12-26: qty 1

## 2022-12-26 MED ORDER — DOXYCYCLINE HYCLATE 100 MG PO CAPS: 100.0000 mg | ORAL_CAPSULE | Freq: Two times a day (BID) | ORAL | 0 refills | Status: AC

## 2022-12-26 MED ORDER — DOXYCYCLINE HYCLATE 100 MG PO TABS
100.0000 mg | ORAL_TABLET | Freq: Once | ORAL | Status: AC
  Administered 2022-12-26: 100 mg via ORAL
  Filled 2022-12-26: qty 1

## 2022-12-26 NOTE — ED Provider Notes (Signed)
[place] EMERGENCY DEPARTMENT AT [facility] Provider Note   CSN: [id number] Arrival date & time: 12/26/22  1816     History  Chief Complaint  Patient presents with   Abscess    William Escobar is a 42 y.o. male with no significant past medical history presents with concern for a bump behind his right ear.  He states the bump has been there for past 6 months, but has been getting more painful within the last couple of days.  He notes subjective fever and chills at home, but no reported temperature.  He denies any drainage from the area.  Denies any ear discharge.  Denies any history of immunocompromise or diabetes.   Abscess Associated symptoms: no fever        Home Medications Prior to Admission medications   Medication Sig Start Date End Date Taking? Authorizing Provider  doxycycline (VIBRAMYCIN) 100 MG capsule Take 1 capsule (100 mg total) by mouth 2 (two) times daily for 7 days. 12/26/22 01/02/23 Yes Arabella Merles, PA-C  acetaminophen (TYLENOL) 325 MG tablet Take 2 tablets (650 mg total) by mouth every 6 (six) hours as needed for mild pain, moderate pain, fever or headache. 04/16/19   Pricilla Loveless, MD  artificial tears (LACRILUBE) OINT ophthalmic ointment Place into both eyes at bedtime as needed for dry eyes. Patient not taking: Reported on 05/23/2020 12/03/19   Pricilla Loveless, MD  diclofenac (VOLTAREN) 75 MG EC tablet Take 1 tablet (75 mg total) by mouth 2 (two) times daily. Patient not taking: Reported on 05/23/2020 03/30/20   Cristie Hem, PA-C  hydrochlorothiazide (HYDRODIURIL) 12.5 MG tablet Take 1 tablet (12.5 mg total) by mouth daily. As needed for pedal edema 05/23/20   Hoy Register, MD  HYDROcodone-acetaminophen (NORCO/VICODIN) 5-325 MG tablet Take 1 tablet by mouth every 6 (six) hours as needed for moderate pain. Patient not taking: No sig reported    [provider]  ibuprofen (ADVIL) 800 MG tablet Take 1 tablet (800 mg total) by  mouth every 8 (eight) hours as needed. 05/23/20   Hoy Register, MD  oxyCODONE-acetaminophen (PERCOCET/ROXICET) 5-325 MG tablet Take 1-2 tablets by mouth every 4 (four) hours as needed for severe pain. Patient not taking: No sig reported 10/22/19   Harlene Salts A, PA-C  predniSONE (DELTASONE) 20 MG tablet 3 tabs po daily x 2 days, then 2 tabs x 3 days, then 1.5 tabs x 3 days, then 1 tab x 3 days, then 0.5 tabs x 3 days Patient not taking: Reported on 05/23/2020 12/04/19   Pricilla Loveless, MD  valACYclovir (VALTREX) 1000 MG tablet Take 1 tablet (1,000 mg total) by mouth 3 (three) times daily. Patient not taking: Reported on 05/23/2020 12/03/19   Pricilla Loveless, MD      Allergies    Patient has no known allergies.    Review of Systems   Review of Systems  Constitutional:  Negative for fever.    Physical Exam Updated Vital Signs BP (!) 142/90   Pulse 98   Temp 98.2 F (36.8 C) (Oral)   Resp 18   Ht 5' 7.72" (1.72 m)   Wt 75 kg   SpO2 99%   BMI 25.35 kg/m  Physical Exam Vitals and nursing note reviewed.  Constitutional:      General: He is not in acute distress.    Appearance: He is well-developed.  HENT:     Head: Normocephalic and atraumatic.     Comments: Fluctuant cyst approximately 1 cm in diameter  behind the right ear over the mastoid.  Mild area of erythema surrounding the cyst.  Exquisitely tender to palpation over the cyst and mastoid.  No drainage noted Eyes:     Conjunctiva/sclera: Conjunctivae normal.  Cardiovascular:     Rate and Rhythm: Normal rate and regular rhythm.     Heart sounds: No murmur heard. Pulmonary:     Effort: Pulmonary effort is normal. No respiratory distress.     Breath sounds: Normal breath sounds.  Abdominal:     Palpations: Abdomen is soft.     Tenderness: There is no abdominal tenderness.  Musculoskeletal:        General: No swelling.     Cervical back: Neck supple.  Skin:    General: Skin is warm and dry.     Capillary Refill:  Capillary refill takes less than 2 seconds.  Neurological:     Mental Status: He is alert.  Psychiatric:        Mood and Affect: Mood normal.       ED Results / Procedures / Treatments   Labs (all labs ordered are listed, but only abnormal results are displayed) Labs Reviewed  CBC WITH DIFFERENTIAL/PLATELET - Abnormal; Notable for the following components:      Result Value   HCT 38.6 (*)    Eosinophils Absolute 0.8 (*)    All other components within normal limits  BASIC METABOLIC PANEL - Abnormal; Notable for the following components:   Creatinine, Ser 0.59 (*)    Calcium 8.6 (*)    All other components within normal limits    EKG None  Radiology CT Temporal Bones Wo Contrast  Result Date: 12/26/2022 CLINICAL DATA:  Right facial abscess EXAM: CT TEMPORAL BONES WITHOUT CONTRAST TECHNIQUE: Axial and coronal plane CT imaging of the petrous temporal bones was performed with thin-collimation image reconstruction. No intravenous contrast was administered. Multiplanar CT image reconstructions were also generated. RADIATION DOSE REDUCTION: This exam was performed according to the departmental dose-optimization program which includes automated exposure control, adjustment of the mA and/or kV according to patient size and/or use of iterative reconstruction technique. COMPARISON:  None Available. FINDINGS: RIGHT TEMPORAL BONE External auditory canal: Normal. Middle ear cavity: Normally aerated. The scutum and ossicles are normal. The tegmen tympani is intact. Inner ear structures: The cochlea, vestibule and semicircular canals are normal. The vestibular aqueduct is not enlarged. Internal auditory and facial nerve canals:  Normal Mastoid air cells: Normally aerated. No osseous erosion. LEFT TEMPORAL BONE External auditory canal: Normal. Middle ear cavity: Normally aerated. The scutum and ossicles are normal. The tegmen tympani is intact. Inner ear structures: The cochlea, vestibule and  semicircular canals are normal. The vestibular aqueduct is not enlarged. Internal auditory and facial nerve canals:  Normal. Mastoid air cells: Normally aerated. No osseous erosion. Vascular: Normal non-contrast appearance of the carotid canals, jugular bulbs and sigmoid plates. Limited intracranial:  No acute or significant finding. Visible orbits/paranasal sinuses: No acute or significant finding. Soft tissues: There is a small focus of induration directly posterior to the right ear (series 3, image 21). No abscess or drainable fluid collection. IMPRESSION: 1. Small focus of induration directly posterior to the right ear, without abscess or drainable fluid collection. 2. Normal temporal bones and mastoids. Electronically Signed   By: Deatra Robinson M.D.   On: 12/26/2022 23:30    Procedures Procedures    Medications Ordered in ED Medications  ketorolac (TORADOL) 30 MG/ML injection 30 mg (30 mg Intramuscular Given 12/26/22 2246)  doxycycline (VIBRA-TABS) tablet 100 mg (100 mg Oral Given 12/26/22 2356)    ED Course/ Medical Decision Making/ A&P                                 Medical Decision Making Amount and/or Complexity of Data Reviewed Labs: ordered. Radiology: ordered.  Risk Prescription drug management.   42 y.o. male presents to the ED for concern of bump behind his right ear that has been present for 6 months but more painful in the past couple days  Differential diagnosis includes but is not limited to cyst, abscess, mastoiditis  ED Course:  Patient presents with concern for a bump behind his right ear that had been there for the past 6 months but has gotten more painful within the last couple of days.  He notes subjective fever and chills at home, but no recorded temperature here today.  On exam, he has a fluctuant feeling cyst approximately 1 cm diameter behind his right ear with no obvious drainage.  He is very tender to palpation over this area and on the mastoid.  He is  slightly tachycardic on examination.  Although he denied any immunocompromise or diabetes, given significant tenderness to palpation of this area and tachycardia, CT was obtained to evaluate for possible mastoiditis.  CT did not show any signs of mastoiditis or any drainable fluid collection behind the right ear.  CBC was without leukocytosis.  Given this finding and the small visual size of the cyst, will not attempt incision and drainage at this time. Patient was given IM Toradol which upon re-evaluation improved his pain and he is much more comfortable appearing.  I discussed the results of the CT and labs with the patient, discussed that he will be discharged home with 7-day course of doxycycline and I encouraged him to use warm compresses on the area.  Recommended he follow-up with his primary care provider if his symptoms have not improved by the end of his antibiotic therapy.  Patient given first dose of his doxycycline here in the ER.   Impression: Cyst behind right ear  Disposition:  The patient was discharged home with instructions to take 7-day course of doxycycline and ibuprofen as needed for pain.  Apply warm compresses to his bump to encourage drainage.  Follow-up with PCP if symptoms have not started to improve within the next week. Return precautions given.  Lab Tests: I Ordered, and personally interpreted labs.  The pertinent results include:   CBC without leukocytosis, BMP unremarkable  Imaging Studies ordered: I ordered imaging studies including CT temporal I independently visualized the imaging with scope of interpretation limited to determining acute life threatening conditions related to emergency care. Imaging showed no signs of mastoiditis I agree with the radiologist interpretation             Final Clinical Impression(s) / ED Diagnoses Final diagnoses:  Dermoid cyst of right ear    Rx / DC Orders ED Discharge Orders          Ordered    doxycycline  (VIBRAMYCIN) 100 MG capsule  2 times daily        12/26/22 2354              Arabella Merles, PA-C 12/27/22 0023    Terald Sleeper, MD 12/27/22 1058

## 2022-12-26 NOTE — ED Triage Notes (Signed)
Pt arrived POV with abscess to posterior of R ear behind a mole that has been there for about 6 months. In the past week, c/o HA, neck pain, fever/chills at night and difficulty sleeping. C/o pain 10/10.

## 2022-12-26 NOTE — Discharge Instructions (Addendum)
Your CT scan today showed no signs of deeper infection.  You have been prescribed doxycycline. Take this antibiotic 2 times a day for the next 7 days. Take the full course of your antibiotic even if you start feeling better. Antibiotics may cause you to have diarrhea. You have been given the first dose here today.  You may use up to 800mg  ibuprofen every 8 hours as needed for pain.  Do not exceed 2.4g of ibuprofen per day.  Please apply warm compresses to the bump behind your right ear for 10-15 minutes 4 times a day for the next week.  This will help your bump to drain.  Please return to the ER if you have any unexplained fevers at home over 100.4, you have uncontrollable pain, any other new or concerning symptoms.  Su tomografa computarizada de hoy no mostr signos de infeccin ms profunda.  Le han recetado doxiciclina. Tome este antibitico 2 veces al [employer] prximos 7 [address]  [phone]. Tome el tratamiento completo con antibiticos incluso si comienza a Actor. Los antibiticos pueden causarle diarrea. Le han dado la primera dosis hoy aqu.  Puede usar hasta 800 mg de ibuprofeno cada 8 horas segn sea necesario para Chief Technology Officer.  No exceda los 2,4 g de ibuprofeno al da.  Aplique compresas tibias en el bulto detrs de la oreja derecha durante 10 a 15 minutos, 4 veces al [employer] prxima semana.  Esto ayudar a que tu panza drene.  Regrese a la sala de emergencias si tiene fiebre inexplicable en casa de ms de 100.4, tiene dolor incontrolable o cualquier otro sntoma nuevo o preocupante.
